# Patient Record
Sex: Male | Born: 1945 | Race: White | Hispanic: No | Marital: Married | Smoking: Never smoker
Health system: Southern US, Community
[De-identification: ages and names within clinical notes are randomized; demographics above are authoritative.]

## PROBLEM LIST (undated history)

## (undated) DIAGNOSIS — I10 Essential (primary) hypertension: Secondary | ICD-10-CM

## (undated) HISTORY — PX: HERNIA REPAIR: SHX51

## (undated) HISTORY — PX: ABDOMINAL AORTIC ANEURYSM REPAIR: SUR1152

## (undated) HISTORY — DX: Essential (primary) hypertension: I10

---

## 2012-10-04 ENCOUNTER — Emergency Department: Payer: Self-pay | Admitting: Emergency Medicine

## 2012-10-04 LAB — COMPREHENSIVE METABOLIC PANEL
Anion Gap: 6 — ABNORMAL LOW (ref 7–16)
BUN: 27 mg/dL — ABNORMAL HIGH (ref 7–18)
Chloride: 106 mmol/L (ref 98–107)
Creatinine: 1.36 mg/dL — ABNORMAL HIGH (ref 0.60–1.30)
EGFR (African American): >60
EGFR (Non-African Amer.): 53 — ABNORMAL LOW
Glucose: 130 mg/dL — ABNORMAL HIGH (ref 65–99)
Osmolality: 284 (ref 275–301)
SGOT(AST): 30 U/L (ref 15–37)
Total Protein: 7.2 g/dL (ref 6.4–8.2)

## 2012-10-04 LAB — CBC
MCHC: 34.3 g/dL (ref 32.0–36.0)
MCV: 89 fL (ref 80–100)
Platelet: 225 10*3/uL (ref 150–440)
RDW: 12.9 % (ref 11.5–14.5)
WBC: 7.4 10*3/uL (ref 3.8–10.6)

## 2012-10-04 LAB — CK TOTAL AND CKMB (NOT AT ARMC): CK, Total: 100 U/L (ref 35–232)

## 2012-10-04 LAB — TROPONIN I: Troponin-I: <0.02 ng/mL

## 2012-10-12 ENCOUNTER — Ambulatory Visit: Payer: Self-pay | Admitting: Family Medicine

## 2012-11-05 ENCOUNTER — Ambulatory Visit: Payer: Self-pay | Admitting: Gastroenterology

## 2013-04-25 ENCOUNTER — Ambulatory Visit: Payer: Self-pay | Admitting: Vascular Surgery

## 2013-06-07 DIAGNOSIS — Z8739 Personal history of other diseases of the musculoskeletal system and connective tissue: Secondary | ICD-10-CM | POA: Insufficient documentation

## 2013-06-07 DIAGNOSIS — Z8679 Personal history of other diseases of the circulatory system: Secondary | ICD-10-CM | POA: Insufficient documentation

## 2013-06-07 DIAGNOSIS — M109 Gout, unspecified: Secondary | ICD-10-CM | POA: Insufficient documentation

## 2013-06-18 ENCOUNTER — Ambulatory Visit: Payer: Self-pay | Admitting: Vascular Surgery

## 2013-06-18 LAB — CREATININE, SERUM
Creatinine: 1.41 mg/dL — ABNORMAL HIGH (ref 0.60–1.30)
EGFR (African American): 58 — ABNORMAL LOW
GFR CALC NON AF AMER: 50 — AB

## 2013-06-18 LAB — BUN: BUN: 37 mg/dL — AB (ref 7–18)

## 2013-06-26 ENCOUNTER — Ambulatory Visit: Payer: Self-pay | Admitting: Vascular Surgery

## 2013-06-26 LAB — URINALYSIS, COMPLETE
BACTERIA: NONE SEEN
Bilirubin,UR: NEGATIVE
Blood: NEGATIVE
Glucose,UR: NEGATIVE mg/dL (ref 0–75)
Ketone: NEGATIVE
Leukocyte Esterase: NEGATIVE
Nitrite: NEGATIVE
PH: 6 (ref 4.5–8.0)
Protein: NEGATIVE
RBC,UR: 1 /HPF (ref 0–5)
Specific Gravity: 1.014 (ref 1.003–1.030)
Squamous Epithelial: NONE SEEN
WBC UR: <1 /HPF (ref 0–5)

## 2013-06-26 LAB — BASIC METABOLIC PANEL
Anion Gap: 3 — ABNORMAL LOW (ref 7–16)
BUN: 27 mg/dL — AB (ref 7–18)
CALCIUM: 9.2 mg/dL (ref 8.5–10.1)
Chloride: 106 mmol/L (ref 98–107)
Co2: 30 mmol/L (ref 21–32)
Creatinine: 1.32 mg/dL — ABNORMAL HIGH (ref 0.60–1.30)
EGFR (African American): >60
EGFR (Non-African Amer.): 55 — ABNORMAL LOW
GLUCOSE: 106 mg/dL — AB (ref 65–99)
Osmolality: 283 (ref 275–301)
Potassium: 4.6 mmol/L (ref 3.5–5.1)
SODIUM: 139 mmol/L (ref 136–145)

## 2013-06-26 LAB — CBC
HCT: 41.6 % (ref 40.0–52.0)
HGB: 13.6 g/dL (ref 13.0–18.0)
MCH: 29.4 pg (ref 26.0–34.0)
MCHC: 32.6 g/dL (ref 32.0–36.0)
MCV: 90 fL (ref 80–100)
Platelet: 247 10*3/uL (ref 150–440)
RBC: 4.61 10*6/uL (ref 4.40–5.90)
RDW: 13.6 % (ref 11.5–14.5)
WBC: 7.4 10*3/uL (ref 3.8–10.6)

## 2013-07-03 ENCOUNTER — Inpatient Hospital Stay: Payer: Self-pay | Admitting: Vascular Surgery

## 2013-07-04 LAB — APTT: Activated PTT: 27.9 secs (ref 23.6–35.9)

## 2013-07-04 LAB — MAGNESIUM: Magnesium: 1.6 mg/dL — ABNORMAL LOW

## 2013-07-04 LAB — CBC WITH DIFFERENTIAL/PLATELET
BASOS ABS: 0.1 10*3/uL (ref 0.0–0.1)
Basophil %: 0.9 %
Eosinophil #: 0.2 10*3/uL (ref 0.0–0.7)
Eosinophil %: 2.2 %
HCT: 33.2 % — AB (ref 40.0–52.0)
HGB: 11.2 g/dL — ABNORMAL LOW (ref 13.0–18.0)
LYMPHS ABS: 0.6 10*3/uL — AB (ref 1.0–3.6)
LYMPHS PCT: 7.4 %
MCH: 30.1 pg (ref 26.0–34.0)
MCHC: 33.7 g/dL (ref 32.0–36.0)
MCV: 89 fL (ref 80–100)
Monocyte #: 0.7 x10 3/mm (ref 0.2–1.0)
Monocyte %: 8.5 %
Neutrophil #: 6.9 10*3/uL — ABNORMAL HIGH (ref 1.4–6.5)
Neutrophil %: 81 %
Platelet: 207 10*3/uL (ref 150–440)
RBC: 3.72 10*6/uL — AB (ref 4.40–5.90)
RDW: 13.2 % (ref 11.5–14.5)
WBC: 8.5 10*3/uL (ref 3.8–10.6)

## 2013-07-04 LAB — COMPREHENSIVE METABOLIC PANEL
ALBUMIN: 2.7 g/dL — AB (ref 3.4–5.0)
ALT: 15 U/L (ref 12–78)
Alkaline Phosphatase: 69 U/L
Anion Gap: 6 — ABNORMAL LOW (ref 7–16)
BUN: 17 mg/dL (ref 7–18)
Bilirubin,Total: 0.6 mg/dL (ref 0.2–1.0)
Calcium, Total: 8.5 mg/dL (ref 8.5–10.1)
Chloride: 105 mmol/L (ref 98–107)
Co2: 28 mmol/L (ref 21–32)
Creatinine: 1.15 mg/dL (ref 0.60–1.30)
EGFR (African American): >60
EGFR (Non-African Amer.): >60
Glucose: 131 mg/dL — ABNORMAL HIGH (ref 65–99)
Osmolality: 281 (ref 275–301)
Potassium: 4.3 mmol/L (ref 3.5–5.1)
SGOT(AST): 41 U/L — ABNORMAL HIGH (ref 15–37)
Sodium: 139 mmol/L (ref 136–145)
TOTAL PROTEIN: 5.8 g/dL — AB (ref 6.4–8.2)

## 2013-07-04 LAB — PROTIME-INR
INR: 1.1
Prothrombin Time: 13.9 secs (ref 11.5–14.7)

## 2013-07-04 LAB — PHOSPHORUS: Phosphorus: 3.1 mg/dL (ref 2.5–4.9)

## 2013-11-26 DIAGNOSIS — E1169 Type 2 diabetes mellitus with other specified complication: Secondary | ICD-10-CM | POA: Insufficient documentation

## 2014-05-10 NOTE — Op Note (Signed)
PATIENT NAME:  Jeffery Giles, Jeffery Giles MR#:  [id number] DATE OF BIRTH:  09/13/1945  DATE OF PROCEDURE:  07/03/2013  This is a co-surgeon note, along with Dr. Gilda CreaseSchnier.  I will be dictating his portion of the procedure.  PREOPERATIVE DIAGNOSES: 1.  Abdominal aortic, bilateral iliac artery aneurysms.  2.  Mild chronic kidney disease.  3.  Hypertension.  4.  Right external iliac artery dissection.  POSTOPERATIVE DIAGNOSES: 1.  Abdominal aortic, bilateral iliac artery aneurysms.  2.  Mild chronic kidney disease.  3.  Hypertension.  4.  Right external iliac artery dissection.   PROCEDURES: 1.  Ultrasound guidance for vascular access to bilateral femoral arteries, right by Dr. Gilda CreaseSchnier, left by Dr. Wyn Quakerew.  2.  Catheter placement in the left femoral artery by Dr. Gilda CreaseSchnier from right femoral approach.  3.  Catheter placement in aorta from left femoral approach by Dr. Wyn Quakerew.  4.  Placement of an Endologix Powerlink unibody endoprosthesis, co-surgeons.  5.  Placement of a left common iliac artery extender cuff, co-surgeons.  6.  Placement of 2 Endologix extension cuffs on the right and 2 Viabahn stents on the right for treatment of the aneurysm, as well as the right external iliac artery dissection that was creating an endoleak at the conclusion of the aneurysm, co-surgeons.  7.  ProGlide closure device bilateral femoral arteries, right by Dr. Gilda CreaseSchnier, left by Dr. Wyn Quakerew.   ANESTHESIA: General.   ESTIMATED BLOOD LOSS: Approximately 200 mL.  FLUOROSCOPY TIME:  24 minutes.  CONTRAST USED:  110 mL.   INDICATION FOR PROCEDURE: A 69 year old male with an iliac artery aneurysm, largest on the right. The left iliac artery was mildly aneurysmal to treat the entirety of this disease including the aorta. A typical endovascular stent graft was selected with planned extension down into the right iliac system. In addition, there was already present right iliac artery dissection down in the external iliac artery and we  planned to treat this as well. He had already had preemptive coiling of his right hypogastric artery.   DESCRIPTION OF PROCEDURE: The patient is brought to the vascular suite with help from anesthesia providing general anesthetic. His abdomen and groins were sterilely prepped and draped and a sterile surgical field was created. The femoral arteries were accessed bilaterally under direct ultrasound guidance. I performed the left; Dr. Gilda CreaseSchnier performed the right, and 6-French sheaths were placed. We then placed 2 ProGlide closure devices in a preclose fashion on each side. Again, Dr. Gilda CreaseSchnier performed the right and I performed the left. We upsized to 8-French sheaths. The Powerlink Excluder endoprosthesis was placed up in the right and I snared the contralateral wire from the left and pulled this out the left femoral sheath and Dr. Gilda CreaseSchnier advancing the catheter out the left femoral sheath from the right femoral approach. We then pulled the device down on the aortic bifurcation and completed its deployment.  I rewired the contralateral catheter with an 0.014 wire, and then exchanged for a pigtail catheter and performed imaging and then placed an Amplatz Super Stiff wire. To help treat the left iliac, an extension cuff on the left was performed. This was a 20 mm diameter x 55 mm length extension. This went down to about 1 cm to 2 cm from the hypogastric artery on the left.  On the right, there was a little more complex repair. We placed a 13 mm Viabahn up from the external iliac artery up to the junction of the main body from the right.  We then took an Endologix extender 16 mm in diameter from just below the flow divider down into the Viabahn stent; so 2 components were placed after the initial main body on the right. All junction points and seal zones were treated with the [facility] balloon. However, there was still significant endoleak on the right. This appeared to be from the proximal junction point and a 20 mm  diameter proximal and 13 mm diameter distal Endologix limb extender was used, placed just to the flow divider and down into the Viabahn stent. This was post dilated and this did improve the proximal seal; however, there was retrograde flow as we had not entirely treated the dissection in the external iliac artery. We had to extend an additional 13 mm diameter x 5 cm length Viabahn stent down to the very distal external iliac artery to completely exclude the dissection and avoid the retrograde flow that was then filling into the sac. This was post dilated with a 12 mm balloon. At this point, the pigtail catheter was placed off to the left and an excellent angiograph completion result was seen with patent renal arteries, patent left hypogastric artery, complete exclusion of the aneurysms without endoleak.  At this point, we elected to terminate the procedure. The sheaths removed. The ProGlides were secured down and hemostasis was achieved on each side. Each skin incision was closed with a single 4-0 Monocryl and Dermabond was placed as dressing. The patient was awakened from anesthesia and taken to the recovery room in stable condition having tolerated the procedure well.   ____________________________ Annice Needy, MD jsd:ce D: 07/03/2013 13:26:10 ET T: 07/03/2013 14:05:24 ET JOB#: [id number]  cc: Annice Needy, MD, <Dictator> Marisue Ivan, MD Annice Needy MD ELECTRONICALLY SIGNED 07/04/2013 10:53

## 2014-05-10 NOTE — Op Note (Signed)
PATIENT NAME:  Jeffery Giles, Jeffery Giles MR#:  [id number] DATE OF BIRTH:  06/20/1945  DATE OF PROCEDURE:  06/18/2013  PREOPERATIVE DIAGNOSES: 1.  Abdominal aortic aneurysm with common iliac artery aneurysm extending to the external and internal iliac arteries.  2.  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   POSTOPERATIVE DIAGNOSES: 1.  Abdominal aortic aneurysm with common iliac artery aneurysm extending to the external and internal iliac arteries.  2.  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   PROCEDURE PERFORMED:   1.  Introduction catheter into branches of the internal iliac artery, third order catheter placement.  2.  Angiography of the pelvis.  3.  Coil embolization of the internal iliac artery.   SURGEON: Hortencia Pilar, M.D.   SEDATION: Versed 5 mg plus fentanyl 200 mcg administered IV. Continuous ECG, pulse oximetry and cardiopulmonary monitoring was performed throughout the entire procedure by the interventional radiology nurse. Total sedation time was 1 hour 15 minutes.   ACCESS: A 6-French sheath, left common femoral artery.   FLUOROSCOPY TIME: 22.2 minutes.   CONTRAST USED: Isovue 50 mL.   INDICATIONS: Jeffery Giles is a 69 year old gentleman who has a large abdominal aortic aneurysm as well as bilateral common iliac artery aneurysms. On the right, aneurysm extends to include the origins of the external and internal and, therefore, in order to obtain appropriate feel using an endograft he will require coil embolization of his right internal iliac artery. In addition, the right internal iliac artery has an isolated aneurysm that begins approximately 12 to 14 mm from the origin and is a maximum of 19 mm in diameter. This will also treat this aneurysm and prevent future rupture. Risks and benefits are reviewed. All questions answered. The patient agrees to proceed.   DESCRIPTION OF PROCEDURE: The patient is taken to the special  procedure suite, placed in the supine position. After adequate sedation is achieved, both groins are prepped and draped in sterile fashion. Lidocaine 1% is infiltrated in the soft tissues overlying the femoral impulse on the left and access is obtained under ultrasound visualization. Common femoral artery is identified. It is echolucent and pulsatile, indicating patency. Image is recorded for the permanent record and under real-time visualization a micropuncture kit is utilized to access the artery. J-wire is then advanced followed by a 5-French sheath and a rim catheter. Rim catheter is used to hook the aortic bifurcation and a stiff-angled Glidewire is advanced down. Rim catheter is then advanced into the common iliac where hand injection of contrast is utilized to demonstrate the iliac bifurcation. Several oblique views are obtained. The exact origin remains somewhat difficult to ascertain and the stiff-angled Glidewire is reintroduced. Initially, a 5-French Ansell but ultimately a 6-French Balkan sheath is advanced up and over the bifurcation and positioned with its tip in the common. Several catheters are utilized. Ultimately, the C2 catheter and stiff-angled Glidewire gain access to the internal and the wire, catheter and subsequently sheath are advanced into the internal iliac artery aneurysm. Imaging is then obtained and subsequently coils are placed, a total of 7 coils, two 18 x 20 coils from [employer], two 12 x 40 from [employer], one 12 x 20 and one 10 x 20 from [employer]; in addition, a 20 x 20 nester coil was utilized. Followup imaging demonstrates sluggish flow through the aneurysm with densely packed coiling as would be desired. The sheath is pulled into the external on the left. Oblique view is  obtained and StarClose device is deployed. There are no immediate complications.   INTERPRETATION: Initial images demonstrate a large aneurysm within the common. There is also a 19 cm  isolated internal iliac artery aneurysm. There is a neck at the origin of the internal iliac which measures approximately 14 to 18 mm in length. Following soiling, there is a marked reduction in flow.   SUMMARY: Successful coil embolization of the internal iliac artery in preparation for  abdominal aortic aneurysm stent graft repair.    ____________________________ Katha Cabal, MD ggs:cs D: 06/18/2013 15:09:50 ET T: 06/18/2013 18:57:19 ET JOB#: [id number]  cc: Katha Cabal, MD, <Dictator> Dion Body, MD Katha Cabal MD ELECTRONICALLY SIGNED 07/02/2013 9:17

## 2014-05-10 NOTE — Op Note (Signed)
PATIENT NAME:  Jeffery Giles, Jeffery Giles MR#:  [id number] DATE OF BIRTH:  [birth year]  DATE OF PROCEDURE:  07/03/2013  PREOPERATIVE DIAGNOSES:  1. Bilateral iliac artery aneurysms.  2. Dissection of the right external iliac artery.   POSTOPERATIVE DIAGNOSES:  1. Bilateral iliac artery aneurysms.  2. Dissection of the right external iliac artery.   PROCEDURES PERFORMED:  1. Repair of bilateral iliac artery aneurysms using an Endologix stent graft.  2. 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  3. Introduction catheter into aorta percutaneously, right groin approach.  4. Introduction catheter into aorta percutaneously, left groin approach.  5. Iliac extension right side from right groin approach.  6. Iliac extension left side from left groin approach.   PROCEDURE PERFORMED BY: Dr. Gilda Crease and Dr. Wyn Quaker, co-surgeons.   ANESTHESIA: General by endotracheal intubation.   FLUIDS: Per anesthesia record.   ESTIMATED BLOOD LOSS: 200 mL.   SPECIMEN: None.   CONTRAST USED: Isovue 110 mL.   FLUOROSCOPY TIME: 24.3 minutes.   INDICATIONS:  This patient is a 69 year old gentleman who presented to the office after incidental finding of a very large right common iliac artery aneurysm. The aneurysm measured almost 5 cm in diameter, the typical size for repair if it were an aorta. He was also noted to have an internal iliac aneurysm on the right and the aneurysm extended to the external iliac on the right.  A dissection was also noted in the right external iliac artery at that time. On the left, a 2 cm iliac artery aneurysm was identified. The aorta was actually normal in size measuring 18 mm; therefore, this represented isolated bilateral common iliac artery aneurysms. The risks and benefits for angiography and intervention with exclusion using stent grafts was reviewed. All questions have been answered and the patient has agreed to proceed.   DESCRIPTION OF PROCEDURE: The patient is  taken to the special procedure suite, placed in the supine position. After adequate general anesthesia is induced and appropriate invasive monitors are placed, he is prepped from the nipple line down to the knees and then draped in a sterile fashion. Appropriate timeout is called.   Working simultaneously with Dr. Wyn Quaker on the left side and myself on the right, access to the common femoral arteries is obtained with ultrasound guidance. Ultrasound is placed in a sterile sleeve. Common femoral artery is identified.  Femoral bifurcation is localized and then the artery is scanned more proximally. Seldinger needle is then inserted into the anterior wall under direct visualization after images are recorded for the permanent record bilaterally. J-wires were then advanced and 6-French sheaths.   Using the pre-close technique, 2 Perclose devices are placed at the 11 o'clock and 1 o'clock  positions on the right and on the left.  The 8-French sheaths are then inserted. Pigtail catheter is then advanced into the aorta from the right and following AP imaging and then RAO imaging. A  Amplatz Super Stiff wire was advanced through the pigtail catheter and the pigtail catheter was removed. The sheath is upsized to an 18-French sheath; 6000 units of heparin were given prior to upsizing the sheath. A Kumpe catheter was then introduced into the aorta over the J-wire from the left, and subsequently the snare was advanced into the aorta from the left-sided approach.   After reviewing the initial images, a 25 x 80 x 16 x 40 main body Endologix prosthesis was opened and prepped onto the field.  It was then advanced up to  the hub of the sheath, and subsequently, the contralateral limb wire was advanced through the sheath. It was snared by Dr. Wyn Quakerew and then pulled out the left side. As the wire was being pulled out, the main body is being advanced until it is opened just above the bifurcation. The main body is then seated on the  bifurcation and the right limb is deployed. On the left, the .018 platinum plus wire was advanced through the hypotube and the outer sheath is removed from contralateral limb expanding the contralateral limb.   A pigtail catheter was then advanced over the .018 wire and an RAO projection of the left pelvis is obtained. Subsequently, an Amplatz wire is exchanged through the pigtail. The sheath is upsized again to the 18-French and a 20 x 20 x 50 contralateral extension limb is opened onto the field, prepped, and deployed into the existing main body prosthesis.   At this time, a 13 x 100 Viabahn stent is deployed extending several centimeters into the right external, and then a 16 x 16 x 5 limb is used to bridge the Viabahn to the main body of the Endologix.   CODA balloon is then advanced up the right and all limb junctions are angioplastied.  A CODA balloon is then advanced up the left and both junctions are angioplastied.   With the pigtail catheter from the left side, AP projection of the aorta and iliac artery system is performed and a type I endoleak is noted into the right common iliac. No endoleak on the left is noted. These areas were then re-angioplastied, but the leak persisted and, therefore, a 20 x 13 x 88 limb was then added, extending from the bifurcation of the main body down into the existing Viabahn.   AP projection of the aorta from the left-sided approach was again obtained and this time a delayed filling of the aneurysm sac was noted. Clearly, the type I leak previously identified was resolved, and on imaging more distally in the external, the re-entry point of the external iliac dissection remained uncovered and there was now flow down the endograft into the Viabahn and then retrograde through the dissection and back into the aneurysm sac. Therefore, a 13 x 50 Viabahn was opened, prepped on the back table, and deployed extending the right limb repair down to the lateral superficial  circumflex vessels.   Angioplasty was then performed, and again an AP projection from the main body was obtained this time demonstrating both right and left common iliac artery aneurysms were excluded and the iliac dissection was now completely excluded and there was no longer any flow in the false lumen.   The sheaths were then removed and the Perclose device is secured. Pressure was held for approximately 10 minutes, and subsequently, the skin was closed with a 4-0 Monocryl subcuticular and Dermabond. The patient tolerated the procedure well. There were no immediate complications. Sponge and needle counts were correct and he was taken to the recovery room in stable condition.   INTERPRETATION: Initially, there is a very large common iliac aneurysm on the right and a moderate-sized common iliac artery aneurysm on the left. There is also an external iliac artery dissection. Following deployment of the initial stents, there remains a type I endoleak and this ultimately is resolved by adding a 2nd extender limb. The dissection is covered proximally but not distally, and in order to completely exclude the dissection from retrograde flow, subsequently filling the aneurysm sac, a 2nd Viabahn stent  was utilized to extend the right limb down to just above the femoral head. Subsequent images demonstrate complete exclusion of both iliac artery aneurysms as well as the external iliac artery dissection.    ____________________________ Renford Dills, MD ggs:dd D: 07/04/2013 19:33:00 ET T: 07/05/2013 04:28:39 ET JOB#: [id number]  cc: Renford Dills, MD, <Dictator> Renford Dills MD ELECTRONICALLY SIGNED 07/23/2013 15:19

## 2015-12-22 DIAGNOSIS — Z7185 Encounter for immunization safety counseling: Secondary | ICD-10-CM | POA: Insufficient documentation

## 2016-08-10 ENCOUNTER — Other Ambulatory Visit (INDEPENDENT_AMBULATORY_CARE_PROVIDER_SITE_OTHER): Payer: Self-pay | Admitting: Vascular Surgery

## 2016-08-10 DIAGNOSIS — I70219 Atherosclerosis of native arteries of extremities with intermittent claudication, unspecified extremity: Secondary | ICD-10-CM | POA: Insufficient documentation

## 2016-08-10 DIAGNOSIS — I2510 Atherosclerotic heart disease of native coronary artery without angina pectoris: Secondary | ICD-10-CM | POA: Insufficient documentation

## 2016-08-10 DIAGNOSIS — E039 Hypothyroidism, unspecified: Secondary | ICD-10-CM | POA: Insufficient documentation

## 2016-08-10 DIAGNOSIS — I10 Essential (primary) hypertension: Secondary | ICD-10-CM | POA: Insufficient documentation

## 2016-08-10 DIAGNOSIS — I714 Abdominal aortic aneurysm, without rupture, unspecified: Secondary | ICD-10-CM | POA: Insufficient documentation

## 2016-08-10 DIAGNOSIS — Z95828 Presence of other vascular implants and grafts: Secondary | ICD-10-CM

## 2016-08-10 NOTE — Progress Notes (Signed)
MRN : [id number]  Jeffery Giles is a 71 y.o. (08/01/1945) male who presents with chief complaint of No chief complaint on file. Marland Kitchen.  History of Present Illness: The patient returns to the office for surveillance of an abdominal aortic aneurysm status post stent graft placement on:  PROCEDURES PERFORMED on 07/03/2013:  1.          Repair of bilateral iliac artery aneurysms using an Endologix stent graft.  2.          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  3.          Introduction catheter into aorta percutaneously, right groin approach.  4.          Introduction catheter into aorta percutaneously, left groin approach.  5.          Iliac extension right side from right groin approach.  6.          Iliac extension left side from left groin approach.   Patient denies abdominal pain or back pain, no other abdominal complaints. No groin related complaints. No symptoms consistent with distal embolization No changes in claudication distance.   There have been no interval changes in his overall healthcare since his last visit.   Patient denies amaurosis fugax or TIA symptoms. There is no history of claudication or rest pain symptoms of the lower extremities. The patient denies angina or shortness of breath.   Duplex US of the aorta and iliac arteries shows a stable aneurysm sac with no endoleak.     No outpatient prescriptions have been marked as taking for the 08/11/16 encounter (Appointment) with Gilda CreaseSchnier, Latina CraverGregory G, MD.    No past medical history on file.  No past surgical history on file.  Social History Social History  Substance Use Topics  . Smoking status: Not on file  . Smokeless tobacco: Not on file  . Alcohol use Not on file    Family History No family history on file.  Allergies not on file   REVIEW OF SYSTEMS (Negative unless checked)  Constitutional: [] Weight loss  [] Fever  [] Chills Cardiac: [] Chest pain   [] Chest pressure    [] Palpitations   [] Shortness of breath when laying flat   [] Shortness of breath with exertion. Vascular:  [] Pain in legs with walking   [] Pain in legs at rest  [] History of DVT   [] Phlebitis   [] Swelling in legs   [] Varicose veins   [] Non-healing ulcers Pulmonary:   [] Uses home oxygen   [] Productive cough   [] Hemoptysis   [] Wheeze  [] COPD   [] Asthma Neurologic:  [] Dizziness   [] Seizures   [] History of stroke   [] History of TIA  [] Aphasia   [] Vissual changes   [] Weakness or numbness in arm   [] Weakness or numbness in leg Musculoskeletal:   [] Joint swelling   [] Joint pain   [] Low back pain Hematologic:  [] Easy bruising  [] Easy bleeding   [] Hypercoagulable state   [] Anemic Gastrointestinal:  [] Diarrhea   [] Vomiting  [] Gastroesophageal reflux/heartburn   [] Difficulty swallowing. Genitourinary:  [] Chronic kidney disease   [] Difficult urination  [] Frequent urination   [] Blood in urine Skin:  [] Rashes   [] Ulcers  Psychological:  [] History of anxiety   []  History of major depression.  Physical Examination  There were no vitals filed for this visit. There is no height or weight on file to calculate BMI. Gen: WD/WN, NAD Head: [place]/AT, No temporalis wasting.  Ear/Nose/Throat: Hearing grossly intact, nares w/o erythema  or drainage Eyes: PER, EOMI, sclera nonicteric.  Neck: Supple, no large masses.   Pulmonary:  Good air movement, no audible wheezing bilaterally, no use of accessory muscles.  Cardiac: RRR, no JVD Vascular: no carotid bruits Vessel Right Left  Radial Palpable Palpable  PT Palpable Palpable  DP Palpable Palpable  Gastrointestinal: Non-distended. No guarding/no peritoneal signs.  Musculoskeletal: M/S 5/5 throughout.  No deformity or atrophy.  Neurologic: CN 2-12 intact. Symmetrical.  Speech is fluent. Motor exam as listed above. Psychiatric: Judgment intact, Mood & affect appropriate for pt's clinical situation. Dermatologic: No rashes or ulcers noted.  No changes consistent with  cellulitis. Lymph : No lichenification or skin changes of chronic lymphedema.  CBC Lab Results  Component Value Date   WBC 8.5 07/04/2013   HGB 11.2 (L) 07/04/2013   HCT 33.2 (L) 07/04/2013   MCV 89 07/04/2013   PLT 207 07/04/2013    BMET    Component Value Date/Time   NA 139 07/04/2013 0345   K 4.3 07/04/2013 0345   CL 105 07/04/2013 0345   CO2 28 07/04/2013 0345   GLUCOSE 131 (H) 07/04/2013 0345   BUN 17 07/04/2013 0345   CREATININE 1.15 07/04/2013 0345   CALCIUM 8.5 07/04/2013 0345   GFRNONAA >60 07/04/2013 0345   GFRAA >60 07/04/2013 0345   CrCl cannot be calculated (Patient's most recent lab result is older than the maximum 21 days allowed.).  COAG Lab Results  Component Value Date   INR 1.1 07/04/2013    Radiology No results found.  Assessment/Plan 1. AAA (abdominal aortic aneurysm) without rupture (HCC) Recommend: Patient is status post successful endovascular repair of the AAA.   No further intervention is required at this time.   No endoleak is detected and the aneurysm sac is stable.  The patient will continue antiplatelet therapy as prescribed as well as aggressive management of hyperlipidemia. Exercise is again strongly encouraged.   However, endografts require continued surveillance with ultrasound or CT scan. This is mandatory to detect any changes that allow repressurization of the aneurysm sac.  The patient is informed that this would be asymptomatic.  The patient is reminded that lifelong routine surveillance is a necessity with an endograft. Patient will continue to follow-up at 6 month intervals with ultrasound of the aorta.  - VAS US AORTA/IVC/ILIACS; Future  2. Atherosclerosis of native artery of both lower extremities with intermittent claudication (HCC)  Recommend:  The patient has evidence of atherosclerosis of the lower extremities with claudication.  The patient does not voice lifestyle limiting changes at this point in  time.  Noninvasive studies do not suggest clinically significant change.  No invasive studies, angiography or surgery at this time The patient should continue walking and begin a more formal exercise program.  The patient should continue antiplatelet therapy and aggressive treatment of the lipid abnormalities  No changes in the patient's medications at this time  The patient should continue wearing graduated compression socks 10-15 mmHg strength to control the mild edema.    3. Essential hypertension Continue antihypertensive medications as already ordered, these medications have been reviewed and there are no changes at this time.   4. Hypothyroidism, unspecified type Continue Synthroid as already ordered, these medications have been reviewed and there are no changes at this time.   5. Coronary artery disease involving native coronary artery of native heart with angina pectoris (HCC) Continue cardiac and antihypertensive medications as already ordered and reviewed, no changes at this time.  Continue statin as  ordered and reviewed, no changes at this time  Nitrates PRN for chest pain     Levora Dredge, MD  08/10/2016 10:52 AM

## 2016-08-11 ENCOUNTER — Encounter (INDEPENDENT_AMBULATORY_CARE_PROVIDER_SITE_OTHER): Payer: Self-pay

## 2016-08-11 ENCOUNTER — Ambulatory Visit (INDEPENDENT_AMBULATORY_CARE_PROVIDER_SITE_OTHER): Payer: BLUE CROSS/BLUE SHIELD

## 2016-08-11 ENCOUNTER — Ambulatory Visit (INDEPENDENT_AMBULATORY_CARE_PROVIDER_SITE_OTHER): Payer: BLUE CROSS/BLUE SHIELD | Admitting: Vascular Surgery

## 2016-08-11 ENCOUNTER — Other Ambulatory Visit (INDEPENDENT_AMBULATORY_CARE_PROVIDER_SITE_OTHER): Payer: Self-pay | Admitting: Vascular Surgery

## 2016-08-11 ENCOUNTER — Encounter (INDEPENDENT_AMBULATORY_CARE_PROVIDER_SITE_OTHER): Payer: Self-pay | Admitting: Vascular Surgery

## 2016-08-11 DIAGNOSIS — I25119 Atherosclerotic heart disease of native coronary artery with unspecified angina pectoris: Secondary | ICD-10-CM | POA: Diagnosis not present

## 2016-08-11 DIAGNOSIS — I10 Essential (primary) hypertension: Secondary | ICD-10-CM | POA: Diagnosis not present

## 2016-08-11 DIAGNOSIS — Z8679 Personal history of other diseases of the circulatory system: Secondary | ICD-10-CM

## 2016-08-11 DIAGNOSIS — E039 Hypothyroidism, unspecified: Secondary | ICD-10-CM

## 2016-08-11 DIAGNOSIS — I714 Abdominal aortic aneurysm, without rupture, unspecified: Secondary | ICD-10-CM

## 2016-08-11 DIAGNOSIS — Z9889 Other specified postprocedural states: Secondary | ICD-10-CM

## 2016-08-11 DIAGNOSIS — I723 Aneurysm of iliac artery: Secondary | ICD-10-CM | POA: Diagnosis not present

## 2016-08-11 DIAGNOSIS — Z95828 Presence of other vascular implants and grafts: Secondary | ICD-10-CM

## 2016-08-11 DIAGNOSIS — I70213 Atherosclerosis of native arteries of extremities with intermittent claudication, bilateral legs: Secondary | ICD-10-CM

## 2017-08-10 ENCOUNTER — Encounter (INDEPENDENT_AMBULATORY_CARE_PROVIDER_SITE_OTHER): Payer: Self-pay

## 2017-08-10 ENCOUNTER — Encounter (INDEPENDENT_AMBULATORY_CARE_PROVIDER_SITE_OTHER): Payer: Self-pay | Admitting: Vascular Surgery

## 2017-08-10 ENCOUNTER — Other Ambulatory Visit (INDEPENDENT_AMBULATORY_CARE_PROVIDER_SITE_OTHER): Payer: BLUE CROSS/BLUE SHIELD

## 2017-08-10 ENCOUNTER — Ambulatory Visit (INDEPENDENT_AMBULATORY_CARE_PROVIDER_SITE_OTHER): Payer: BLUE CROSS/BLUE SHIELD | Admitting: Vascular Surgery

## 2017-08-10 ENCOUNTER — Other Ambulatory Visit (INDEPENDENT_AMBULATORY_CARE_PROVIDER_SITE_OTHER): Payer: Self-pay | Admitting: Vascular Surgery

## 2017-08-10 VITALS — BP 149/83 | HR 55 | Resp 16 | Ht 72.0 in | Wt 204.0 lb

## 2017-08-10 DIAGNOSIS — I10 Essential (primary) hypertension: Secondary | ICD-10-CM

## 2017-08-10 DIAGNOSIS — I714 Abdominal aortic aneurysm, without rupture, unspecified: Secondary | ICD-10-CM

## 2017-08-10 DIAGNOSIS — I25119 Atherosclerotic heart disease of native coronary artery with unspecified angina pectoris: Secondary | ICD-10-CM | POA: Diagnosis not present

## 2017-08-10 DIAGNOSIS — I70213 Atherosclerosis of native arteries of extremities with intermittent claudication, bilateral legs: Secondary | ICD-10-CM | POA: Diagnosis not present

## 2017-08-10 DIAGNOSIS — I713 Abdominal aortic aneurysm, ruptured, unspecified: Secondary | ICD-10-CM

## 2017-08-10 NOTE — Progress Notes (Signed)
MRN : [id number]  Jeffery Giles is a 72 y.o. (1945-10-13) male who presents with chief complaint of  Chief Complaint  Patient presents with  . Follow-up    53yr Aorta Iliac  .  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  3.Introduction catheter into aorta percutaneously, right groin approach.  4.Introduction catheter into aorta percutaneously, left groin approach.  5.Iliac extension right side from right groin approach.  6.Iliac extension left side from left groin approach.   Patient denies abdominal pain or back pain, no other abdominal complaints. No groin related complaints. No symptoms consistent with distal embolization No changes in claudication distance.   There have been no interval changes in his overall healthcare since his last visit.   Patient denies amaurosis fugax or TIA symptoms. There is no history of claudication or rest pain symptoms of the lower extremities. The patient denies angina or shortness of breath.   Previous duplex US of the aorta and iliac arteries shows a stable aneurysm sac with no endoleak.  Today the right common iliac artery aneurysm has increased in size (prevously 5.0 cm) to 5.4 cm and there is evidence of an endoleak unspecified     Current Meds  Medication Sig  . allopurinol (ZYLOPRIM) 300 MG tablet TAKE 1 TABLET (300 MG TOTAL) BY MOUTH ONCE DAILY.  Marland Kitchen aspirin EC 81 MG tablet Take by mouth.  . bisoprolol-hydrochlorothiazide (ZIAC) 10-6.25 MG tablet   . indomethacin (INDOCIN) 50 MG capsule TAKE 1 CAPSULE (50 MG TOTAL) BY MOUTH 2 (TWO) TIMES DAILY AS NEEDED.  Marland Kitchen levothyroxine  (SYNTHROID, LEVOTHROID) 100 MCG tablet Take 112 mcg by mouth.     Past Medical History:  Diagnosis Date  . Hypertension     Past Surgical History:  Procedure Laterality Date  . ABDOMINAL AORTIC ANEURYSM REPAIR    . HERNIA REPAIR      Social History Social History   Tobacco Use  . Smoking status: Never Smoker  . Smokeless tobacco: Never Used  Substance Use Topics  . Alcohol use: Yes    Comment: ocassionally  . Drug use: No    Family History Family History  Problem Relation Age of Onset  . Stroke Father     No Known Allergies   REVIEW OF SYSTEMS (Negative unless checked)  Constitutional: [] Weight loss  [] Fever  [] Chills Cardiac: [] Chest pain   [] Chest pressure   [] Palpitations   [] Shortness of breath when laying flat   [] Shortness of breath with exertion. Vascular:  [x] Pain in legs with walking   [] Pain in legs at rest  [] History of DVT   [] Phlebitis   [] Swelling in legs   [] Varicose veins   [] Non-healing ulcers Pulmonary:   [] Uses home oxygen   [] Productive cough   [] Hemoptysis   [] Wheeze  [] COPD   [] Asthma Neurologic:  [] Dizziness   [] Seizures   [] History of stroke   [] History of TIA  [] Aphasia   [] Vissual changes   [] Weakness or numbness in arm   [] Weakness or numbness in leg Musculoskeletal:   [] Joint swelling   [] Joint pain   [] Low back pain Hematologic:  [] Easy bruising  [] Easy bleeding   [] Hypercoagulable state   [] Anemic Gastrointestinal:  [] Diarrhea   [] Vomiting  [] Gastroesophageal  reflux/heartburn   [] Difficulty swallowing. Genitourinary:  [] Chronic kidney disease   [] Difficult urination  [] Frequent urination   [] Blood in urine Skin:  [] Rashes   [] Ulcers  Psychological:  [] History of anxiety   []  History of major depression.  Physical Examination  Vitals:   08/10/17 0846  BP: (!) 149/83  Pulse: (!) 55  Resp: 16  Weight: 204 lb (92.5 kg)  Height: 6' (1.829 m)   Body mass index is 27.67 kg/m. Gen: WD/WN, NAD Head: [place]/AT, No temporalis wasting.    Ear/Nose/Throat: Hearing grossly intact, nares w/o erythema or drainage Eyes: PER, EOMI, sclera nonicteric.  Neck: Supple, no large masses.   Pulmonary:  Good air movement, no audible wheezing bilaterally, no use of accessory muscles.  Cardiac: RRR, no JVD Vascular:  Vessel Right Left  Radial Palpable Palpable  Popliteal Palpable Palpable  PT Not Palpable Not Palpable  DP Not Palpable Not Palpable  Gastrointestinal: Non-distended. No guarding/no peritoneal signs.  Musculoskeletal: M/S 5/5 throughout.  No deformity or atrophy.  Neurologic: CN 2-12 intact. Symmetrical.  Speech is fluent. Motor exam as listed above. Psychiatric: Judgment intact, Mood & affect appropriate for pt's clinical situation. Dermatologic: No rashes or ulcers noted.  No changes consistent with cellulitis. Lymph : No lichenification or skin changes of chronic lymphedema.  CBC Lab Results  Component Value Date   WBC 8.5 07/04/2013   HGB 11.2 (L) 07/04/2013   HCT 33.2 (L) 07/04/2013   MCV 89 07/04/2013   PLT 207 07/04/2013    BMET    Component Value Date/Time   NA 139 07/04/2013 0345   K 4.3 07/04/2013 0345   CL 105 07/04/2013 0345   CO2 28 07/04/2013 0345   GLUCOSE 131 (H) 07/04/2013 0345   BUN 17 07/04/2013 0345   CREATININE 1.15 07/04/2013 0345   CALCIUM 8.5 07/04/2013 0345   GFRNONAA >60 07/04/2013 0345   GFRAA >60 07/04/2013 0345   CrCl cannot be calculated (Patient's most recent lab result is older than the maximum 21 days allowed.).  COAG Lab Results  Component Value Date   INR 1.1 07/04/2013    Radiology No results found.    Assessment/Plan  1. AAA (abdominal aortic aneurysm) without rupture (HCC) Recommend: The patient has an abdominal aortic aneurysm that is s/p EVAR but has now increased in size and shows an endoleak by duplex scan.  This may need additional repair.  The patient is otherwise in reasonable health.   Therefore, the patient may need to undergo endovascular  repair of the AAA to prevent future leathal rupture.   Patient will require CT angiography of the abdomen and pelvis in order to appropriately plan repair of the AAA.  The risks and benefits as well as the alternative therapies was discussed in detail with the patient. All questions were answered. The patient agrees to move forward the AAA repair and therefore with CT scan.  The patient will follow up with me in the office after the CT scan to review the study.    CT ANGIO ABDOMEN PELVIS  W &/OR WO CONTRAST; Future (HCC)   2. Atherosclerosis of native artery of both lower extremities with intermittent claudication   Recommend:  The patient has evidence of atherosclerosis of the lower extremities with claudication.  The patient does not voice lifestyle limiting changes at this point in time.  Noninvasive studies do not suggest clinically significant change.  No invasive studies, angiography or surgery at this time The patient should continue walking and begin a  more formal exercise program.  The patient should continue antiplatelet therapy and aggressive treatment of the lipid abnormalities  No changes in the patient's medications at this time  The patient should continue wearing graduated compression socks 10-15 mmHg strength to control the mild edema.    3. Coronary artery disease involving native coronary artery of native heart with angina pectoris (HCC) Continue cardiac and antihypertensive medications as already ordered and reviewed, no changes at this time.  Continue statin as ordered and reviewed, no changes at this time  Nitrates PRN for chest pain   4. Essential hypertension Continue antihypertensive medications as already ordered, these medications have been reviewed and there are no changes at this time.      Levora DredgeGregory Glendel Jaggers, MD  08/10/2017 9:02 AM

## 2017-08-25 ENCOUNTER — Ambulatory Visit
Admission: RE | Admit: 2017-08-25 | Discharge: 2017-08-25 | Disposition: A | Discharge status: Home / Self Care | Payer: BLUE CROSS/BLUE SHIELD | Source: Ambulatory Visit | Attending: Vascular Surgery | Admitting: Vascular Surgery

## 2017-08-25 DIAGNOSIS — I713 Abdominal aortic aneurysm, ruptured, unspecified: Secondary | ICD-10-CM

## 2017-08-25 DIAGNOSIS — I723 Aneurysm of iliac artery: Secondary | ICD-10-CM | POA: Diagnosis not present

## 2017-08-25 DIAGNOSIS — Z95828 Presence of other vascular implants and grafts: Secondary | ICD-10-CM | POA: Insufficient documentation

## 2017-08-25 DIAGNOSIS — I722 Aneurysm of renal artery: Secondary | ICD-10-CM | POA: Diagnosis not present

## 2017-08-25 DIAGNOSIS — N200 Calculus of kidney: Secondary | ICD-10-CM | POA: Insufficient documentation

## 2017-08-25 DIAGNOSIS — K5730 Diverticulosis of large intestine without perforation or abscess without bleeding: Secondary | ICD-10-CM | POA: Diagnosis not present

## 2017-08-25 LAB — POCT I-STAT CREATININE: CREATININE: 1.2 mg/dL (ref 0.61–1.24)

## 2017-08-25 MED ORDER — IOPAMIDOL (ISOVUE-370) INJECTION 76%
100.0000 mL | Freq: Once | INTRAVENOUS | Status: AC | PRN
  Administered 2017-08-25: 100 mL via INTRAVENOUS

## 2017-09-04 ENCOUNTER — Encounter (INDEPENDENT_AMBULATORY_CARE_PROVIDER_SITE_OTHER): Payer: Self-pay | Admitting: Vascular Surgery

## 2017-09-04 ENCOUNTER — Ambulatory Visit (INDEPENDENT_AMBULATORY_CARE_PROVIDER_SITE_OTHER): Payer: BLUE CROSS/BLUE SHIELD | Admitting: Vascular Surgery

## 2017-09-04 VITALS — BP 149/79 | HR 57 | Resp 12 | Ht 72.0 in | Wt 204.0 lb

## 2017-09-04 DIAGNOSIS — I714 Abdominal aortic aneurysm, without rupture, unspecified: Secondary | ICD-10-CM

## 2017-09-04 DIAGNOSIS — I70213 Atherosclerosis of native arteries of extremities with intermittent claudication, bilateral legs: Secondary | ICD-10-CM | POA: Diagnosis not present

## 2017-09-04 DIAGNOSIS — I25119 Atherosclerotic heart disease of native coronary artery with unspecified angina pectoris: Secondary | ICD-10-CM

## 2017-09-04 DIAGNOSIS — I10 Essential (primary) hypertension: Secondary | ICD-10-CM

## 2017-09-04 NOTE — Progress Notes (Signed)
MRN : [id number]  Jeffery PhoenixCharles E Giles is a 72 y.o. (06/06/1945) male who presents with chief complaint of No chief complaint on file. Marland Kitchen.  History of Present Illness: The patient is seen for follow up evaluation of AAA status post CTA. There were no problems or complications related to the CT scan. The patient denies interval development of abdominal or back pain. No new lower extremity pain or discoloration of the toes.   The patient has a history of coronary artery disease, no recent episodes of angina or shortness of breath. The patient denies interval anaurosis fugax. There is o recent history of TIA symptoms or focal motor deficits. The patient denies PAD or claudication symptoms. There is a history of hyperlipidemia which is being treated with a statin.   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   No outpatient medications have been marked as taking for the 09/04/17 encounter (Appointment) with Gilda CreaseSchnier, Latina CraverGregory G, MD.    Past Medical History:  Diagnosis Date  . Hypertension     Past Surgical History:  Procedure Laterality Date  . ABDOMINAL AORTIC ANEURYSM REPAIR    . HERNIA REPAIR      Social History Social History   Tobacco Use  . Smoking status: Never Smoker  . Smokeless tobacco: Never Used  Substance Use Topics  . Alcohol use: Yes    Comment: ocassionally  . Drug use: No    Family History Family History  Problem Relation Age of Onset  . Stroke Father     No Known Allergies   REVIEW OF SYSTEMS (Negative unless checked)  Constitutional: [] Weight loss  [] Fever  [] Chills Cardiac: [] Chest pain   [] Chest pressure   [] Palpitations   [] Shortness of breath when laying flat   [] Shortness of breath with exertion. Vascular:  [] Pain in legs with walking   [] Pain in legs at rest  [] History of DVT   [] Phlebitis   [] Swelling in legs   [] Varicose veins   [] Non-healing ulcers Pulmonary:   [] Uses home oxygen   [] Productive  cough   [] Hemoptysis   [] Wheeze  [] COPD   [] Asthma Neurologic:  [] Dizziness   [] Seizures   [] History of stroke   [] History of TIA  [] Aphasia   [] Vissual changes   [] Weakness or numbness in arm   [] Weakness or numbness in leg Musculoskeletal:   [] Joint swelling   [] Joint pain   [] Low back pain Hematologic:  [] Easy bruising  [] Easy bleeding   [] Hypercoagulable state   [] Anemic Gastrointestinal:  [] Diarrhea   [] Vomiting  [] Gastroesophageal reflux/heartburn   [] Difficulty swallowing. Genitourinary:  [] Chronic kidney disease   [] Difficult urination  [] Frequent urination   [] Blood in urine Skin:  [] Rashes   [] Ulcers  Psychological:  [] History of anxiety   []  History of major depression.  Physical Examination  There were no vitals filed for this visit. There is no height or weight on file to calculate BMI. Gen: WD/WN, NAD Head: [place]/AT, No temporalis wasting.  Ear/Nose/Throat: Hearing grossly intact, nares w/o erythema or drainage Eyes: PER, EOMI, sclera nonicteric.  Neck: Supple, no large masses.   Pulmonary:  Good air movement, no audible wheezing bilaterally, no use of accessory muscles.  Cardiac: RRR, no JVD Vascular:  Vessel Right Left  Radial Palpable Palpable  PT Palpable Palpable  DP Palpable Palpable  Gastrointestinal: Non-distended. No guarding/no peritoneal signs.  Musculoskeletal: M/S 5/5 throughout.  No deformity or atrophy.  Neurologic: CN 2-12 intact. Symmetrical.  Speech is fluent. Motor exam as listed above. Psychiatric: Judgment intact, Mood & affect appropriate for pt's clinical situation. Dermatologic: No rashes or ulcers noted.  No changes consistent with cellulitis. Lymph : No lichenification or skin changes of chronic lymphedema.  CBC Lab Results  Component Value Date   WBC 8.5 07/04/2013   HGB 11.2 (L) 07/04/2013   HCT 33.2 (L) 07/04/2013   MCV 89 07/04/2013   PLT 207 07/04/2013    BMET    Component Value Date/Time   NA 139 07/04/2013 0345   K 4.3  07/04/2013 0345   CL 105 07/04/2013 0345   CO2 28 07/04/2013 0345   GLUCOSE 131 (H) 07/04/2013 0345   BUN 17 07/04/2013 0345   CREATININE 1.20 08/25/2017 1129   CREATININE 1.15 07/04/2013 0345   CALCIUM 8.5 07/04/2013 0345   GFRNONAA >60 07/04/2013 0345   GFRAA >60 07/04/2013 0345   CrCl cannot be calculated (Unknown ideal weight.).  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 EXAM: CT ANGIOGRAPHY ABDOMEN AND PELVIS WITH CONTRAST AND WITHOUT CONTRAST TECHNIQUE: Multidetector CT imaging of the abdomen and pelvis was performed using the standard protocol during bolus administration of intravenous contrast. Multiplanar reconstructed images and MIPs were obtained and reviewed to evaluate the vascular anatomy. CONTRAST:  ISOVUE-370 IOPAMIDOL (ISOVUE-370) INJECTION 76% COMPARISON:  CT abdomen and pelvis - 04/25/2013 FINDINGS: VASCULAR Aorta: Interval endovascular repair of right common iliac artery aneurysm with placement of a abdominal aortic endograft. The proximal limb of the stent graft appears incompletely apposed against the right side of the infrarenal abdominal aorta (image 101, series 8; coronal image 78, series 11 however there is no definitive opacification the otherwise excluded native abdominal aorta. The cranial aspect of the endovascular stent appears incompletely expanded at its mid aspect (axial image 118, series 8; coronal image 68, series 11). The native abdominal aortic aneurysm sac has minimally increased in size the interval, currently measuring 2.8 x 2.6 x 2.8 cm, as measured in greatest oblique short axis axial (image 121, series 8), coronal (image 68, series 11) and sagittal (image 95, series 13) dimensions respectively, previously,  previously, 2.4 x 2.2 x 2.2 cm. While a definitive endoleak is not identified, there is increased attenuation of the mural thrombus within the native abdominal aortic aneurysm sac when comparing the precontrast images to the delayed portal venous phase images (representative precontrast image 15, series 6; portal venous phase image 21, series 19 and precontrast image 19, series 6, portal venous phase image 24, series 19), worrisome for the presence of a endoleak. Again a definitive source is not identified though the IMA does appear patent. Note, the right common iliac artery aneurysm has increased in size though there is no definitive evidence of persistent opacification the right common iliac artery aneurysm on this examination, specifically, the Hounsfield units on the precontrast and portal venous phase images is unchanged (precontrast image 32, series 6; portal venous phase image 36, series 19). Celiac: Widely patent without hemodynamically significant narrowing. Conventional branching pattern. SMA: Widely patent without hemodynamically significant narrowing. Conventional branching pattern. The distal tributaries the SMA appear widely patent without discrete intraluminal filling defect to suggest distal embolism. Renals: There is a tiny accessory left renal artery which supplies the inferior pole the right  kidney. Both dominant right-sided renal arteries appear widely patent without hemodynamically significant stenosis. Unchanged approximately 1.3 x 1.0 cm aneurysm arising from the central aspect of the inferior division of the right renal artery. No associated mural thrombus or perivascular stranding. Mild fusiform ectasia of the superior division of the right renal artery without associated aneurysm. No vessel irregularity to suggest FMD. IMA: Appears patent and potentially contributed to a type 2 endoleak. Inflow: There is extension of the endovascular aortic stent graft across the residual large right  common iliac artery aneurysm. Overlapping stent grafts appear widely patent. The distal anastomosis is well apposed against the walls of the distal aspect of the right external iliac artery. The right common iliac artery aneurysm has increased in size, currently measuring 5.7 x 5.8 x 5.4 cm as measured in greatest oblique short axis axial (image 54, series 8), coronal (image 79, series 11) and sagittal (image 81, series 13) dimensions respectively, previously, 4.7 x 5.2 x 5.0 cm. Stable sequela of right internal iliac artery coil embolism without definitive evidence of persistent opacification though evaluation degraded secondary streak artifact from the coil pack. The left limb of the aortic stent graft is well apposed against the walls of the left common external iliac artery. The left external iliac artery remains mildly ectatic measuring 1.9 cm in diameter (axial image 159, series 8), unchanged compared to the 04/2013 examination. Mild ectasia of the right internal iliac artery. The left external iliac artery is of normal caliber and widely patent without hemodynamically significant stenosis. Surgical clip adjacent to the left common femoral artery likely represents the location of prior arterial access. Proximal Outflow: The bilateral common and imaged portions of the bilateral superficial and deep femoral arte arteries are of normal caliber and widely patent without hemodynamically significant stenosis. Veins: The IVC and pelvic venous system appear widely patent. Note is again made of a circumaortic left renal vein. Review of the MIP images confirms the above findings. NON-VASCULAR Lower chest: Limited visualization of the lower thorax demonstrates minimal dependent subpleural ground-glass atelectasis. No discrete focal airspace opacities. Cardiomegaly.  No pericardial effusion. Hepatobiliary: Normal hepatic contour. No discrete hepatic lesions. Normal appearance of the gallbladder given degree distention. No  radiopaque gallstones. No intra extrahepatic biliary duct dilatation. No ascites. Pancreas: Normal appearance of the pancreas Spleen: Normal appearance of the spleen Adrenals/Urinary Tract: Note is made of an approximately 1.7 cm hypoattenuating nonenhancing partially exophytic left-sided renal cyst. Note is made of mild fetal lobulation of the bilateral kidneys. Nonobstructing left-sided nephrolithiasis with dominant nonobstructing left-sided renal stone measuring 5 mm in diameter (image 7, series 6). There is symmetric enhancement and excretion of the bilateral kidneys. There is a minimal amount of likely age and body habitus related bilateral perinephric stranding. No urine obstruction. Normal appearance of the bilateral adrenal glands. Normal appearance of the urinary bladder given degree distention. Stomach/Bowel: Colonic diverticulosis without evidence diverticulitis apparent circumferential wall thickening involving the rectum similar though likely progressed compared to remote abdominal CT performed in 2015. Moderate colonic stool burden without evidence of enteric obstruction. Normal appearance of the terminal ileum and appendix. No pneumoperitoneum, pneumatosis or portal venous gas. Lymphatic: No bulky retroperitoneal, mesenteric, pelvic or inguinal lymphadenopathy. Reproductive: Dystrophic calcifications within normal sized prostate gland. Multiple phleboliths are seen with the lower pelvis bilaterally. No free fluid in the pelvic cul-de-sac. Other: Regional soft tissues appear normal. Musculoskeletal: No acute or aggressive osseous abnormalities. Moderate to severe multilevel lumbar spine DDD, worse at L2-L3, L4-L5 and L5-S1 with  disc space height loss, endplate irregularity and sclerosis. IMPRESSION: VASCULAR 1. Post endovascular repair of right common iliac artery aneurysm with extension of an aortic stent graft. While the stent graft appears patent, the cranial aspect of the endovascular stent graft  is incompletely apposed to the wall of the infrarenal abdominal aorta and there appears to be incomplete expansion of the cranial aspect of the stent graft. 2. Interval increase in size of both the native abdominal aortic aneurysm currently measuring 2.8 cm, previously, 2.4 cm, as well as the right common iliac artery aneurysm currently measuring 5.8 cm, previously, 5.2 cm. While a definitive endoleak is not identified, there is apparent opacification of the native abdominal aortic aneurysm sac and there is suspected persistent opacification of the origin of the IMA potentially indicative of a very small type 2 endoleak versus a type 4/type 5 endoleak. Further evaluation with dedicated abdominal aortic and mesenteric arteriogram could be performed as indicated. 3. Post coil embolization of the right internal iliac artery without definitive evidence of recanalization given associated streak artifact. 4. 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 NON-VASCULAR 1. Colonic diverticulosis without evidence of diverticulitis. 2. Nonobstructing left-sided nephrolithiasis. Electronically Signed   By: Simonne ComeJohn  Watts M.D.   On: 08/25/2017 13:07     Assessment/Plan 1. AAA (abdominal aortic aneurysm) without rupture [facility](HCC)  - Ambulatory referral to Cardiology  Recommend:   The aneurysm is > 5 cm and therefore should undergo repair. Patient is status post CT scan of the abdominal aorta. The patient is a candidate for endovascular repair.   He will require cardiac clearance.   The patient will continue antiplatelet therapy as prescribed (since the patient is undergoing endovascular repair as opposed to open repair) as well as aggressive management of hyperlipidemia. Exercise is again strongly encouraged.   The patient is reminded that lifetime routine surveillance is a necessity with an endograft.   The risks and benefits of AAA repair are reviewed with  the patient.  All questions are answered.  Alternative therapies are also discussed.  The patient agrees to proceed with endovascular aneurysm repair.  Patient will follow-up with me in the office after the surgery.  2. Atherosclerosis of native artery of both lower extremities with intermittent claudication (HCC)  Recommend:  The patient has evidence of atherosclerosis of the lower extremities with claudication.  The patient does not voice lifestyle limiting changes at this point in time.  Noninvasive studies do not suggest clinically significant change.  No invasive studies, angiography or surgery at this time The patient should continue walking and begin a more formal exercise program.  The patient should continue antiplatelet therapy and aggressive treatment of the lipid abnormalities  No changes in the patient's medications at this time  The patient should continue wearing graduated compression socks 10-15 mmHg strength to control the mild edema.    3. Coronary artery disease involving native coronary artery of native heart with angina pectoris (HCC) Continue cardiac and antihypertensive medications as already ordered and reviewed, no changes at this time.  Continue statin as ordered and reviewed, no changes at this time  Nitrates PRN for chest pain   4. Essential hypertension Continue antihypertensive medications as already ordered, these medications have been reviewed and there are no changes at this time.     Levora DredgeGregory Schnier, MD  09/04/2017 8:50 AM

## 2017-09-09 ENCOUNTER — Encounter (INDEPENDENT_AMBULATORY_CARE_PROVIDER_SITE_OTHER): Payer: Self-pay | Admitting: Vascular Surgery

## 2017-10-03 ENCOUNTER — Other Ambulatory Visit (INDEPENDENT_AMBULATORY_CARE_PROVIDER_SITE_OTHER): Payer: Self-pay | Admitting: Nurse Practitioner

## 2017-10-03 ENCOUNTER — Encounter: Payer: Self-pay | Admitting: Anesthesiology

## 2017-10-03 MED ORDER — CEFAZOLIN SODIUM-DEXTROSE 2-4 GM/100ML-% IV SOLN
2.0000 g | INTRAVENOUS | Status: AC
  Administered 2017-10-04: 2 g via INTRAVENOUS

## 2017-10-04 ENCOUNTER — Encounter: Payer: Self-pay | Admitting: Anesthesiology

## 2017-10-04 ENCOUNTER — Inpatient Hospital Stay: Payer: BLUE CROSS/BLUE SHIELD | Admitting: Anesthesiology

## 2017-10-04 ENCOUNTER — Inpatient Hospital Stay
Admission: RE | Admit: 2017-10-04 | Discharge: 2017-10-05 | DRG: 269 | Disposition: A | Discharge status: Home / Self Care | Payer: BLUE CROSS/BLUE SHIELD | Attending: Vascular Surgery | Admitting: Vascular Surgery

## 2017-10-04 ENCOUNTER — Encounter
Admission: RE | Disposition: A | Discharge status: Home / Self Care | Payer: Self-pay | Source: Ambulatory Visit | Attending: Vascular Surgery

## 2017-10-04 DIAGNOSIS — I10 Essential (primary) hypertension: Secondary | ICD-10-CM | POA: Diagnosis present

## 2017-10-04 DIAGNOSIS — Y712 Prosthetic and other implants, materials and accessory cardiovascular devices associated with adverse incidents: Secondary | ICD-10-CM | POA: Diagnosis present

## 2017-10-04 DIAGNOSIS — Z79899 Other long term (current) drug therapy: Secondary | ICD-10-CM

## 2017-10-04 DIAGNOSIS — Z823 Family history of stroke: Secondary | ICD-10-CM | POA: Diagnosis not present

## 2017-10-04 DIAGNOSIS — E785 Hyperlipidemia, unspecified: Secondary | ICD-10-CM | POA: Diagnosis present

## 2017-10-04 DIAGNOSIS — I714 Abdominal aortic aneurysm, without rupture, unspecified: Secondary | ICD-10-CM | POA: Diagnosis present

## 2017-10-04 DIAGNOSIS — T82898A Other specified complication of vascular prosthetic devices, implants and grafts, initial encounter: Principal | ICD-10-CM | POA: Diagnosis present

## 2017-10-04 DIAGNOSIS — I25119 Atherosclerotic heart disease of native coronary artery with unspecified angina pectoris: Secondary | ICD-10-CM | POA: Diagnosis present

## 2017-10-04 DIAGNOSIS — I70213 Atherosclerosis of native arteries of extremities with intermittent claudication, bilateral legs: Secondary | ICD-10-CM | POA: Diagnosis present

## 2017-10-04 HISTORY — PX: ENDOVASCULAR REPAIR/STENT GRAFT: CATH118280

## 2017-10-04 LAB — MRSA PCR SCREENING: MRSA by PCR: NEGATIVE

## 2017-10-04 LAB — TYPE AND SCREEN
ABO/RH(D): O POS
Antibody Screen: NEGATIVE

## 2017-10-04 LAB — BASIC METABOLIC PANEL
Anion gap: 9 (ref 5–15)
BUN: 26 mg/dL — ABNORMAL HIGH (ref 8–23)
CALCIUM: 9.5 mg/dL (ref 8.9–10.3)
CO2: 29 mmol/L (ref 22–32)
Chloride: 104 mmol/L (ref 98–111)
Creatinine, Ser: 1.13 mg/dL (ref 0.61–1.24)
Glucose, Bld: 124 mg/dL — ABNORMAL HIGH (ref 70–99)
Potassium: 3.9 mmol/L (ref 3.5–5.1)
SODIUM: 142 mmol/L (ref 135–145)

## 2017-10-04 LAB — CBC WITH DIFFERENTIAL/PLATELET
BASOS ABS: 0.1 10*3/uL (ref 0–0.1)
BASOS PCT: 1 %
Eosinophils Absolute: 0.3 10*3/uL (ref 0–0.7)
Eosinophils Relative: 3 %
HCT: 46.7 % (ref 40.0–52.0)
Hemoglobin: 15.9 g/dL (ref 13.0–18.0)
Lymphocytes Relative: 17 %
Lymphs Abs: 1.3 10*3/uL (ref 1.0–3.6)
MCH: 31.8 pg (ref 26.0–34.0)
MCHC: 33.9 g/dL (ref 32.0–36.0)
MCV: 93.6 fL (ref 80.0–100.0)
MONO ABS: 0.6 10*3/uL (ref 0.2–1.0)
Monocytes Relative: 7 %
Neutro Abs: 5.7 10*3/uL (ref 1.4–6.5)
Neutrophils Relative %: 72 %
PLATELETS: 203 10*3/uL (ref 150–440)
RBC: 4.99 MIL/uL (ref 4.40–5.90)
RDW: 13.8 % (ref 11.5–14.5)
WBC: 7.9 10*3/uL (ref 3.8–10.6)

## 2017-10-04 LAB — PROTIME-INR
INR: 0.96
Prothrombin Time: 12.7 seconds (ref 11.4–15.2)

## 2017-10-04 LAB — GLUCOSE, CAPILLARY: Glucose-Capillary: 120 mg/dL — ABNORMAL HIGH (ref 70–99)

## 2017-10-04 LAB — APTT: APTT: 30 s (ref 24–36)

## 2017-10-04 LAB — ABO/RH: ABO/RH(D): O POS

## 2017-10-04 SURGERY — ENDOVASCULAR REPAIR/STENT GRAFT
Anesthesia: General

## 2017-10-04 MED ORDER — OXYCODONE-ACETAMINOPHEN 5-325 MG PO TABS: 1.0000 | ORAL_TABLET | ORAL | Status: DC | PRN

## 2017-10-04 MED ORDER — DOPAMINE-DEXTROSE 3.2-5 MG/ML-% IV SOLN: 3.0000 ug/kg/min | INTRAVENOUS | Status: DC

## 2017-10-04 MED ORDER — LIDOCAINE HCL (CARDIAC) PF 100 MG/5ML IV SOSY
PREFILLED_SYRINGE | INTRAVENOUS | Status: DC | PRN
  Administered 2017-10-04: 100 mg via INTRAVENOUS

## 2017-10-04 MED ORDER — SUGAMMADEX SODIUM 200 MG/2ML IV SOLN
INTRAVENOUS | Status: DC | PRN
  Administered 2017-10-04: 200 mg via INTRAVENOUS

## 2017-10-04 MED ORDER — EPHEDRINE SULFATE 50 MG/ML IJ SOLN
INTRAMUSCULAR | Status: AC
  Filled 2017-10-04: qty 1

## 2017-10-04 MED ORDER — ONDANSETRON HCL 4 MG/2ML IJ SOLN: 4.0000 mg | Freq: Four times a day (QID) | INTRAMUSCULAR | Status: DC | PRN

## 2017-10-04 MED ORDER — LACTATED RINGERS IV SOLN
INTRAVENOUS | Status: DC
  Administered 2017-10-04: 11:00:00 via INTRAVENOUS

## 2017-10-04 MED ORDER — CEFAZOLIN SODIUM-DEXTROSE 2-4 GM/100ML-% IV SOLN
2.0000 g | Freq: Three times a day (TID) | INTRAVENOUS | Status: AC
  Administered 2017-10-04 – 2017-10-05 (×2): 2 g via INTRAVENOUS
  Filled 2017-10-04 (×2): qty 100

## 2017-10-04 MED ORDER — HYDRALAZINE HCL 20 MG/ML IJ SOLN
5.0000 mg | INTRAMUSCULAR | Status: DC | PRN
Start: 2017-10-04 — End: 2017-10-05

## 2017-10-04 MED ORDER — MORPHINE SULFATE (PF) 2 MG/ML IV SOLN: 2.0000 mg | INTRAVENOUS | Status: DC | PRN

## 2017-10-04 MED ORDER — FAMOTIDINE IN NACL 20-0.9 MG/50ML-% IV SOLN
20.0000 mg | Freq: Two times a day (BID) | INTRAVENOUS | Status: DC
Start: 2017-10-04 — End: 2017-10-05

## 2017-10-04 MED ORDER — ROCURONIUM BROMIDE 50 MG/5ML IV SOLN
INTRAVENOUS | Status: AC
  Filled 2017-10-04: qty 1

## 2017-10-04 MED ORDER — DOCUSATE SODIUM 100 MG PO CAPS: 100.0000 mg | ORAL_CAPSULE | Freq: Every day | ORAL | Status: DC

## 2017-10-04 MED ORDER — ROCURONIUM BROMIDE 100 MG/10ML IV SOLN
INTRAVENOUS | Status: DC | PRN
  Administered 2017-10-04: 10 mg via INTRAVENOUS
  Administered 2017-10-04: 30 mg via INTRAVENOUS
  Administered 2017-10-04: 50 mg via INTRAVENOUS

## 2017-10-04 MED ORDER — POTASSIUM CHLORIDE CRYS ER 20 MEQ PO TBCR: 20.0000 meq | EXTENDED_RELEASE_TABLET | Freq: Every day | ORAL | Status: DC | PRN

## 2017-10-04 MED ORDER — FENTANYL CITRATE (PF) 100 MCG/2ML IJ SOLN
INTRAMUSCULAR | Status: DC | PRN
  Administered 2017-10-04: 100 ug via INTRAVENOUS

## 2017-10-04 MED ORDER — DEXAMETHASONE SODIUM PHOSPHATE 10 MG/ML IJ SOLN
INTRAMUSCULAR | Status: DC | PRN
  Administered 2017-10-04: 10 mg via INTRAVENOUS

## 2017-10-04 MED ORDER — NITROGLYCERIN IN D5W 200-5 MCG/ML-% IV SOLN: 5.0000 ug/min | INTRAVENOUS | Status: DC

## 2017-10-04 MED ORDER — LABETALOL HCL 5 MG/ML IV SOLN: 10.0000 mg | INTRAVENOUS | Status: DC | PRN

## 2017-10-04 MED ORDER — PROPOFOL 10 MG/ML IV BOLUS
INTRAVENOUS | Status: DC | PRN
  Administered 2017-10-04: 150 mg via INTRAVENOUS

## 2017-10-04 MED ORDER — LIDOCAINE HCL (PF) 2 % IJ SOLN
INTRAMUSCULAR | Status: AC
  Filled 2017-10-04: qty 10

## 2017-10-04 MED ORDER — ONDANSETRON HCL 4 MG/2ML IJ SOLN: 4.0000 mg | Freq: Once | INTRAMUSCULAR | Status: DC | PRN

## 2017-10-04 MED ORDER — ACETAMINOPHEN 650 MG RE SUPP: 325.0000 mg | RECTAL | Status: DC | PRN

## 2017-10-04 MED ORDER — SUGAMMADEX SODIUM 200 MG/2ML IV SOLN
INTRAVENOUS | Status: AC
  Filled 2017-10-04: qty 2

## 2017-10-04 MED ORDER — HYDROMORPHONE HCL 1 MG/ML IJ SOLN: 1.0000 mg | Freq: Once | INTRAMUSCULAR | Status: DC | PRN

## 2017-10-04 MED ORDER — HEPARIN SODIUM (PORCINE) 1000 UNIT/ML IJ SOLN
INTRAMUSCULAR | Status: DC | PRN
  Administered 2017-10-04: 6000 [IU] via INTRAVENOUS

## 2017-10-04 MED ORDER — HEPARIN SODIUM (PORCINE) 1000 UNIT/ML IJ SOLN
INTRAMUSCULAR | Status: AC
  Filled 2017-10-04: qty 1

## 2017-10-04 MED ORDER — IOPAMIDOL (ISOVUE-300) INJECTION 61%
INTRAVENOUS | Status: DC | PRN
  Administered 2017-10-04: 55 mL via INTRA_ARTERIAL

## 2017-10-04 MED ORDER — MAGNESIUM SULFATE 2 GM/50ML IV SOLN: 2.0000 g | Freq: Every day | INTRAVENOUS | Status: DC | PRN

## 2017-10-04 MED ORDER — ALUM & MAG HYDROXIDE-SIMETH 200-200-20 MG/5ML PO SUSP
15.0000 mL | ORAL | Status: DC | PRN
  Filled 2017-10-04: qty 30

## 2017-10-04 MED ORDER — CLOPIDOGREL BISULFATE 75 MG PO TABS
75.0000 mg | ORAL_TABLET | Freq: Every day | ORAL | Status: DC
  Administered 2017-10-05: 75 mg via ORAL
  Filled 2017-10-04: qty 1

## 2017-10-04 MED ORDER — ALLOPURINOL 300 MG PO TABS
300.0000 mg | ORAL_TABLET | Freq: Every day | ORAL | Status: DC
  Administered 2017-10-04: 300 mg via ORAL
  Filled 2017-10-04: qty 1

## 2017-10-04 MED ORDER — METOPROLOL TARTRATE 5 MG/5ML IV SOLN: 2.0000 mg | INTRAVENOUS | Status: DC | PRN

## 2017-10-04 MED ORDER — ASPIRIN EC 81 MG PO TBEC
81.0000 mg | DELAYED_RELEASE_TABLET | Freq: Every day | ORAL | Status: DC
  Administered 2017-10-04 – 2017-10-05 (×2): 81 mg via ORAL
  Filled 2017-10-04 (×2): qty 1

## 2017-10-04 MED ORDER — LEVOTHYROXINE SODIUM 112 MCG PO TABS
112.0000 ug | ORAL_TABLET | Freq: Every day | ORAL | Status: DC
  Administered 2017-10-05: 112 ug via ORAL
  Filled 2017-10-04: qty 1

## 2017-10-04 MED ORDER — GUAIFENESIN-DM 100-10 MG/5ML PO SYRP
15.0000 mL | ORAL_SOLUTION | ORAL | Status: DC | PRN
  Filled 2017-10-04: qty 15

## 2017-10-04 MED ORDER — EPHEDRINE SULFATE 50 MG/ML IJ SOLN
INTRAMUSCULAR | Status: DC | PRN
  Administered 2017-10-04 (×3): 10 mg via INTRAVENOUS
  Administered 2017-10-04: 5 mg via INTRAVENOUS

## 2017-10-04 MED ORDER — FENTANYL CITRATE (PF) 100 MCG/2ML IJ SOLN
INTRAMUSCULAR | Status: AC
  Filled 2017-10-04: qty 2

## 2017-10-04 MED ORDER — ONDANSETRON HCL 4 MG/2ML IJ SOLN
INTRAMUSCULAR | Status: DC | PRN
  Administered 2017-10-04: 4 mg via INTRAVENOUS

## 2017-10-04 MED ORDER — PROPOFOL 10 MG/ML IV BOLUS
INTRAVENOUS | Status: AC
  Filled 2017-10-04: qty 20

## 2017-10-04 MED ORDER — ACETAMINOPHEN 325 MG PO TABS
325.0000 mg | ORAL_TABLET | ORAL | Status: DC | PRN
  Administered 2017-10-05: 650 mg via ORAL
  Filled 2017-10-04: qty 2

## 2017-10-04 MED ORDER — SODIUM CHLORIDE 0.9 % IV SOLN: INTRAVENOUS | Status: DC

## 2017-10-04 MED ORDER — SODIUM CHLORIDE 0.9 % IV SOLN: 500.0000 mL | Freq: Once | INTRAVENOUS | Status: DC | PRN

## 2017-10-04 MED ORDER — BISOPROLOL-HYDROCHLOROTHIAZIDE 10-6.25 MG PO TABS
1.0000 | ORAL_TABLET | Freq: Every day | ORAL | Status: DC
  Administered 2017-10-04: 1 via ORAL
  Filled 2017-10-04 (×2): qty 1

## 2017-10-04 MED ORDER — PHENOL 1.4 % MT LIQD
1.0000 | OROMUCOSAL | Status: DC | PRN
  Filled 2017-10-04: qty 177

## 2017-10-04 MED ORDER — DEXAMETHASONE SODIUM PHOSPHATE 10 MG/ML IJ SOLN
INTRAMUSCULAR | Status: AC
  Filled 2017-10-04: qty 1

## 2017-10-04 MED ORDER — ONDANSETRON HCL 4 MG/2ML IJ SOLN
INTRAMUSCULAR | Status: AC
  Filled 2017-10-04: qty 2

## 2017-10-04 MED ORDER — FENTANYL CITRATE (PF) 100 MCG/2ML IJ SOLN: 25.0000 ug | INTRAMUSCULAR | Status: DC | PRN

## 2017-10-04 SURGICAL SUPPLY — 56 items
BALLN MUSTANG 12X60X75 (BALLOONS) ×2
BALLN ULTRVRSE 12X60X75 (BALLOONS) ×2
BALLN ULTRVRSE 8X40X75C (BALLOONS) ×2
BALLOON MUSTANG 12X60X75 (BALLOONS) ×1 IMPLANT
BALLOON ULTRVRSE 12X60X75 (BALLOONS) ×1 IMPLANT
BALLOON ULTRVRSE 8X40X75C (BALLOONS) ×1 IMPLANT
BLADE SURG SZ11 CARB STEEL (BLADE) ×2 IMPLANT
BOOT SUTURE AID YELLOW STND (SUTURE) ×2 IMPLANT
CATH ACCU-VU SIZ PIG 5F 70CM (CATHETERS) ×2 IMPLANT
CATH BALLN CODA 9X100X32 (BALLOONS) ×2 IMPLANT
CATH BEACON 5 .035 65 C2 TIP (CATHETERS) ×2 IMPLANT
CATH BEACON 5 .035 65 KMP TIP (CATHETERS) ×2 IMPLANT
CATH IMAGER II S 5FR 65CM (CATHETERS) ×2 IMPLANT
CATH VS15FR (CATHETERS) ×2 IMPLANT
COVER DRAPE FLUORO 36X44 (DRAPES) IMPLANT
DERMABOND ADVANCED (GAUZE/BANDAGES/DRESSINGS) ×1
DERMABOND ADVANCED .7 DNX12 (GAUZE/BANDAGES/DRESSINGS) ×1 IMPLANT
DEVICE CLOSURE PERCLS PRGLD 6F (VASCULAR PRODUCTS) ×6 IMPLANT
DEVICE PRESTO INFLATION (MISCELLANEOUS) ×4 IMPLANT
DEVICE SAFEGUARD 24CM (GAUZE/BANDAGES/DRESSINGS) ×4 IMPLANT
DEVICE TORQUE .025-.038 (MISCELLANEOUS) ×2 IMPLANT
DRYSEAL FLEXSHEATH 14FR 33CM (SHEATH) ×1
DRYSEAL FLEXSHEATH 16FR 33CM (SHEATH) ×1
ELECT REM PT RETURN 9FT ADLT (ELECTROSURGICAL) ×4
ELECTRODE REM PT RTRN 9FT ADLT (ELECTROSURGICAL) ×2 IMPLANT
EXCLUDER TNK 23X14.5MMX12CM (Endovascular Graft) ×1 IMPLANT
EXCLUDER TRUNK 23X14.5MMX12CM (Endovascular Graft) ×2 IMPLANT
GLIDEWIRE STIFF .35X180X3 HYDR (WIRE) ×2 IMPLANT
GLOVE BIO SURGEON STRL SZ7 (GLOVE) ×2 IMPLANT
GLOVE SURG SYN 8.0 (GLOVE) ×2 IMPLANT
GOWN STRL REUS W/ TWL LRG LVL3 (GOWN DISPOSABLE) ×1 IMPLANT
GOWN STRL REUS W/ TWL XL LVL3 (GOWN DISPOSABLE) ×1 IMPLANT
GOWN STRL REUS W/TWL LRG LVL3 (GOWN DISPOSABLE) ×1
GOWN STRL REUS W/TWL XL LVL3 (GOWN DISPOSABLE) ×1
IV NS 1000ML (IV SOLUTION) ×1
IV NS 1000ML BAXH (IV SOLUTION) ×1 IMPLANT
LEG CONTRALATERAL 23X14 (Endovascular Graft) ×2 IMPLANT
LOOP RED MAXI  1X406MM (MISCELLANEOUS) ×1
LOOP VESSEL MAXI 1X406 RED (MISCELLANEOUS) ×1 IMPLANT
LOOP VESSEL MINI 0.8X406 BLUE (MISCELLANEOUS) ×1 IMPLANT
LOOPS BLUE MINI 0.8X406MM (MISCELLANEOUS) ×1
NEEDLE ENTRY 21GA 7CM ECHOTIP (NEEDLE) ×2 IMPLANT
PACK ANGIOGRAPHY (CUSTOM PROCEDURE TRAY) ×2 IMPLANT
PACK BASIN MAJOR ARMC (MISCELLANEOUS) ×2 IMPLANT
PERCLOSE PROGLIDE 6F (VASCULAR PRODUCTS) ×12
SET INTRO CAPELLA COAXIAL (SET/KITS/TRAYS/PACK) ×2 IMPLANT
SHEATH BRITE TIP 6FRX11 (SHEATH) ×4 IMPLANT
SHEATH BRITE TIP 8FRX11 (SHEATH) ×4 IMPLANT
SHEATH DRYSEAL FLEX 14FR 33CM (SHEATH) ×1 IMPLANT
SHEATH DRYSEAL FLEX 16FR 33CM (SHEATH) ×1 IMPLANT
SHIELD X-DRAPE GOLD 12X17 (MISCELLANEOUS) ×4 IMPLANT
SUT MNCRL+ 5-0 UNDYED PC-3 (SUTURE) ×2 IMPLANT
SUT MONOCRYL 5-0 (SUTURE) ×2
TUBING CONTRAST HIGH PRESS 72 (TUBING) ×2 IMPLANT
WIRE AMPLATZ SSTIFF .035X260CM (WIRE) ×4 IMPLANT
WIRE J 3MM .035X145CM (WIRE) ×4 IMPLANT

## 2017-10-04 NOTE — Anesthesia Post-op Follow-up Note (Signed)
Anesthesia QCDR form completed.        

## 2017-10-04 NOTE — Anesthesia Procedure Notes (Signed)
Procedure Name: Intubation Date/Time: 10/04/2017 12:14 PM Performed by: Eben Burow, CRNA Pre-anesthesia Checklist: Patient identified, Emergency Drugs available, Suction available, Patient being monitored and Timeout performed Patient Re-evaluated:Patient Re-evaluated prior to induction Oxygen Delivery Method: Circle system utilized Preoxygenation: Pre-oxygenation with 100% oxygen Induction Type: IV induction Ventilation: Mask ventilation without difficulty Laryngoscope Size: Mac and 4 Grade View: Grade II Tube type: Oral Tube size: 7.5 mm Number of attempts: 2 (1 attempt by SRNA; OETT placed by Mare Ferrari CRNA) Airway Equipment and Method: Bougie stylet Placement Confirmation: ETT inserted through vocal cords under direct vision,  positive ETCO2 and breath sounds checked- equal and bilateral Secured at: 22 cm Tube secured with: Tape Dental Injury: Teeth and Oropharynx as per pre-operative assessment

## 2017-10-04 NOTE — Anesthesia Postprocedure Evaluation (Signed)
Anesthesia Post Note  Patient: Jeffery Giles  Procedure(s) Performed: ENDOVASCULAR REPAIR/STENT GRAFT (N/A )  Patient location during evaluation: Cath Lab Anesthesia Type: General Level of consciousness: awake and alert Pain management: pain level controlled Vital Signs Assessment: post-procedure vital signs reviewed and stable Respiratory status: spontaneous breathing, nonlabored ventilation, respiratory function stable and patient connected to nasal cannula oxygen Cardiovascular status: blood pressure returned to baseline and stable Postop Assessment: no apparent nausea or vomiting Anesthetic complications: no     Last Vitals:  Vitals:   10/04/17 1456 10/04/17 1519  BP: (!) 145/61 (!) 153/87  Pulse: 61 68  Resp: 15 (!) 29  Temp: 36.5 C (!) 36.4 C  SpO2: 99% 99%    Last Pain:  Vitals:   10/04/17 1519  TempSrc: Oral  PainSc:                  Kelcey Wickstrom S

## 2017-10-04 NOTE — Op Note (Addendum)
OPERATIVE NOTE   PROCEDURE: 1. US guidance for vascular access, bilateral femoral arteries 2. Catheter placement into aorta from bilateral femoral approaches 3. Placement of a C3 23 x 14 x 12 Gore Excluder Endoprosthesis main body with a 23 x 14 contralateral limb 4. ProGlide closure devices bilateral femoral arteries  PRE-OPERATIVE DIAGNOSIS: AAA; complication of vascular device with leaking type III endoleak from the remotely placed Endologix graft  POST-OPERATIVE DIAGNOSIS: same  SURGEON: Hortencia Pilar, MD and Leotis Pain, MD - Co-surgeons  ANESTHESIA: general  ESTIMATED BLOOD LOSS: 50 cc  FINDING(S): 1.  AAA  SPECIMEN(S):  none  INDICATIONS:   Jeffery Giles is a 72 y.o. y.o. male who presents with reexpansion of his aneurysm several years after endovascular repair.  This is consistent with a type III endoleak from his Endologix graft.  Repair will be done endovascularly by relining the existing stent graft with a new Gore Excluder stent graft.  The risks and benefits were reviewed with the patient all questions were answered he agrees to proceed.  DESCRIPTION: After obtaining full informed written consent, the patient was brought back to the operating room and placed supine upon the operating table.  The patient received IV antibiotics prior to induction.  After obtaining adequate anesthesia, the patient was prepped and draped in the standard fashion for endovascular AAA repair.  Co-surgeons are required because this is a bilateral procedure with work being performed simultaneously from both the right femoral and left femoral approach.  This also expedites the procedure making a shorter operative time reducing complications and improving patient safety.  We then began by gaining access to both femoral arteries with US guidance with me working on the the patient's right and Dr. Lucky Cowboy working on the the patient's left.  The femoral arteries were found to be patent and accessed  without difficulty with a needle under ultrasound guidance without difficulty on each side and permanent images were recorded.  We then placed 2 proglide devices on each side in a pre-close fashion and placed 8 French sheaths.  The patient was then given 6000 units of intravenous heparin.   Given the existing Endologix graft and the construction that has billowing of the fabric away from the stent which allows a wire and catheter to pass behind the stent and become entangled with the Endologix graft simply advancing the Pigtail catheter over a wire was not adequate.  Therefore, a contralateral catheter was reformed in the iliac limb within the via bond and then advanced into the Endologix graft using the curve to prevent weaving of the wire within the stent interstices.  Once the contralateral catheter was above the graft the Amplatz Super Stiff wire was advanced through the catheter and then the catheter was removed.  An 8 mm x 4 cm balloon was then inflated in the suprarenal position and pulled back through the graft along the wire.  At this point the balloon passed without any restrictions indicating that we were in the true lumen that there was no impedance to withdrawing the balloon because it was tangled within the struts of the existing stent graft.  Of note this similar process was performed from the left side again ensuring that we did not we have the wire within the interstices on the left as we advanced our sheath and catheters.  The pigtail catheter was then placed into the aorta from the right side. Using this image, we selected a 23 x 14 x 12 Main body device.  Over a stiff wire, an 16 French sheath was placed up the right. The main body was then placed through the 16 French sheath. A Kumpe catheter was placed up the left side and a magnified image at the renal arteries was performed. The main body was then deployed just below the lowest renal artery. The C2 catheter with a Glidewire was used to  cannulate the contralateral gate without difficulty and successful cannulation was confirmed by twirling the pigtail catheter in the main body. We then placed a stiff wire and a retrograde arteriogram was performed through the left femoral sheath. We upsized to the 12 [place] sheath for the contralateral limb and a 23 x 14 limb was selected and deployed. The main body deployment was then completed. Based off the angiographic findings, extension limbs were not necessary.  All junction points and seals zones were treated with the compliant balloon.  Following treating the entire length of the new core stent graft from both right and left sides with the Coda balloon to 12 mm x 6 cm Ultraverse balloons were opened onto the field and angioplasty was performed from the flow divider down to the iliac limbs.  3 separate inflations were performed bilaterally simultaneously all were to 10 atm for 30 seconds to 1 minute.  The pigtail catheter was then replaced and a completion angiogram was performed.   No endoleak was detected on completion angiography. The renal arteries were found to be widely patent.  Previous endoleak has now resolved.   At this point we elected to terminate the procedure. We secured the pro glide devices for hemostasis on the femoral arteries. The skin incision was closed with a 4-0 Monocryl. Dermabond and pressure dressing were placed. The patient was taken to the recovery room in stable condition having tolerated the procedure well.  COMPLICATIONS: none  CONDITION: stable  Hortencia Pilar  10/04/2017, 2:11 PM

## 2017-10-04 NOTE — Anesthesia Preprocedure Evaluation (Signed)
Anesthesia Evaluation  Patient identified by MRN, date of birth, ID band Patient awake    Reviewed: Allergy & Precautions, NPO status , Patient's Chart, lab work & pertinent test results, reviewed documented beta blocker date and time   Airway Mallampati: III  TM Distance: >3 FB     Dental  (+) Chipped   Pulmonary           Cardiovascular hypertension, Pt. on medications and Pt. on home beta blockers + CAD and + Peripheral Vascular Disease       Neuro/Psych    GI/Hepatic   Endo/Other  Hypothyroidism   Renal/GU      Musculoskeletal   Abdominal   Peds  Hematology   Anesthesia Other Findings Gout.Last Hb 11.2.  Reproductive/Obstetrics                             Anesthesia Physical Anesthesia Plan  ASA: III  Anesthesia Plan: General   Post-op Pain Management:    Induction: Intravenous  PONV Risk Score and Plan:   Airway Management Planned: Oral ETT  Additional Equipment:   Intra-op Plan:   Post-operative Plan:   Informed Consent: I have reviewed the patients History and Physical, chart, labs and discussed the procedure including the risks, benefits and alternatives for the proposed anesthesia with the patient or authorized representative who has indicated his/her understanding and acceptance.     Plan Discussed with: CRNA  Anesthesia Plan Comments:         Anesthesia Quick Evaluation

## 2017-10-04 NOTE — H&P (Signed)
[facility] History & Physical Update  The patient was interviewed and re-examined.  The patient's previous History and Physical has been reviewed and is unchanged.  There is no change in the plan of care. We plan to proceed with the scheduled procedure.  Levora DredgeGregory Schnier, MD  10/04/2017, 11:14 AM

## 2017-10-04 NOTE — Transfer of Care (Signed)
Immediate Anesthesia Transfer of Care Note  Patient: Jeffery PhoenixCharles E Smylie  Procedure(s) Performed: ENDOVASCULAR REPAIR/STENT GRAFT (N/A )  Patient Location: PACU  Anesthesia Type:General  Level of Consciousness: awake, alert , oriented and patient cooperative  Airway & Oxygen Therapy: Patient Spontanous Breathing and Patient connected to face mask oxygen  Post-op Assessment: Report given to RN and Post -op Vital signs reviewed and stable  Post vital signs: Reviewed and stable  Last Vitals:  Vitals Value Taken Time  BP 152/89 10/04/2017  2:26 PM  Temp    Pulse 71 10/04/2017  2:34 PM  Resp 20 10/04/2017  2:34 PM  SpO2 99 % 10/04/2017  2:34 PM  Vitals shown include unvalidated device data.  Last Pain:  Vitals:   10/04/17 1426  TempSrc:   PainSc: (P) 0-No pain         Complications: No apparent anesthesia complications

## 2017-10-04 NOTE — Op Note (Signed)
OPERATIVE NOTE   PROCEDURE: 1. US guidance for vascular access, bilateral femoral arteries 2. Catheter placement into aorta from bilateral femoral approaches 3. Placement of a 23 mm proximal, 14 mm distal, 12 cm length Gore Excluder Endoprosthesis main body right with a 23 mm diameter by 14 cm length left contralateral limb 4. ProGlide closure devices bilateral femoral arteries  PRE-OPERATIVE DIAGNOSIS: AAA  POST-OPERATIVE DIAGNOSIS: same  SURGEON: Festus BarrenJason Dew, MD and Levora DredgeGregory Schnier, MD - Co-surgeons  ANESTHESIA: General  ESTIMATED BLOOD LOSS: 50 cc  FINDING(S): 1.  AAA  SPECIMEN(S):  none  INDICATIONS:   Jeffery Giles is a 72 y.o. male who presents with a significant endoleak after a previous Endologix endovascular aneurysm repair and growth of his aneurysm sac.  He needs this repaired to prevent lethal rupture.  Using a different device, the anatomy was suitable for endovascular repair.  Risks and benefits of repair in an endovascular fashion were discussed and informed consent was obtained. Co-surgeons are used to expedite the procedure and reduce operative time as bilateral work needs to be done.  DESCRIPTION: After obtaining full informed written consent, the patient was brought back to the operating room and placed supine upon the operating table.  The patient received IV antibiotics prior to induction.  After obtaining adequate anesthesia, the patient was prepped and draped in the standard fashion for endovascular AAA repair.  We then began by gaining access to both femoral arteries with US guidance with me working on the left and Dr. Gilda CreaseSchnier working on the right.  The femoral arteries were found to be patent and accessed without difficulty with a needle under ultrasound guidance without difficulty on each side and permanent images were recorded.  We then placed 2 proglide devices on each side in a pre-close fashion and placed 8 French sheaths. The patient was then given  5000 units of intravenous heparin. The Pigtail catheter was placed into the aorta from the right side.  It should be noted that we advanced into the aorta using a contra catheter to avoid weaving through the interstices of the Endologix device from both sides, the right side first.  We also used an 8 mm balloon pulling this down over the wire to ensure that we were not woven through the interstices.  Using this image, we selected a 23 mm diameter by 12 cm length Main body device.  Over a stiff wire, an 16 French sheath was placed up the right. The main body was then placed through the 16 French sheath. A contra catheter was placed up the left side and a magnified image at the renal arteries was performed. The main body was then deployed just below the lowest renal artery. The Kumpe and then a C2 catheter was used to cannulate the contralateral gate with a mild amount of difficulty and successful cannulation was confirmed by twirling the pigtail catheter in the main body. We then placed a stiff wire and a retrograde arteriogram was performed through the left femoral sheath. We upsized to the 14 [place] sheath for the contralateral limb on the left and a 23 mm diameter by 14 cm length left iliac limb was selected and deployed. The main body deployment was then completed. Based off the angiographic findings, extension limbs were not necessary. All junction points and seals zones were treated with the compliant balloon. The pigtail catheter was then replaced and a completion angiogram was performed.  No obvious Endoleak was detected on completion angiography. The renal arteries  were found to be widely patent.  The left hypogastric artery remained widely patent.  The right hypogastric artery had been previously coil embolized. At this point we elected to terminate the procedure. We secured the pro glide devices for hemostasis on the femoral arteries.  Two additional pro-glide device was necessary on the left side but  hemostasis was achieved.  The skin incision was closed with a 4-0 Monocryl. Dermabond and pressure dressing were placed. The patient was taken to the recovery room in stable condition having tolerated the procedure well.  COMPLICATIONS: none  CONDITION: stable  Festus Barren  10/04/2017, 2:09 PM   This note was created with Dragon Medical transcription system. Any errors in dictation are purely unintentional.

## 2017-10-05 ENCOUNTER — Encounter: Payer: Self-pay | Admitting: Vascular Surgery

## 2017-10-05 LAB — CBC
HEMATOCRIT: 43.8 % (ref 40.0–52.0)
Hemoglobin: 14.8 g/dL (ref 13.0–18.0)
MCH: 32.2 pg (ref 26.0–34.0)
MCHC: 33.8 g/dL (ref 32.0–36.0)
MCV: 95.2 fL (ref 80.0–100.0)
Platelets: 184 10*3/uL (ref 150–440)
RBC: 4.6 MIL/uL (ref 4.40–5.90)
RDW: 13.9 % (ref 11.5–14.5)
WBC: 11.2 10*3/uL — ABNORMAL HIGH (ref 3.8–10.6)

## 2017-10-05 LAB — MAGNESIUM: Magnesium: 2 mg/dL (ref 1.7–2.4)

## 2017-10-05 LAB — BASIC METABOLIC PANEL
Anion gap: 6 (ref 5–15)
BUN: 24 mg/dL — ABNORMAL HIGH (ref 8–23)
CHLORIDE: 103 mmol/L (ref 98–111)
CO2: 31 mmol/L (ref 22–32)
CREATININE: 1.05 mg/dL (ref 0.61–1.24)
Calcium: 9.4 mg/dL (ref 8.9–10.3)
GFR calc non Af Amer: >60 mL/min (ref >60)
GLUCOSE: 168 mg/dL — AB (ref 70–99)
Potassium: 5.4 mmol/L — ABNORMAL HIGH (ref 3.5–5.1)
Sodium: 140 mmol/L (ref 135–145)

## 2017-10-05 LAB — POTASSIUM: POTASSIUM: 4.2 mmol/L (ref 3.5–5.1)

## 2017-10-05 MED ORDER — FAMOTIDINE 20 MG PO TABS: 20.0000 mg | ORAL_TABLET | Freq: Two times a day (BID) | ORAL | Status: DC

## 2017-10-05 MED ORDER — DEXTROSE 50 % IV SOLN
1.0000 | Freq: Once | INTRAVENOUS | Status: AC
  Administered 2017-10-05: 50 mL via INTRAVENOUS
  Filled 2017-10-05: qty 50

## 2017-10-05 MED ORDER — FUROSEMIDE 10 MG/ML IJ SOLN
40.0000 mg | Freq: Once | INTRAMUSCULAR | Status: AC
  Administered 2017-10-05: 40 mg via INTRAVENOUS
  Filled 2017-10-05: qty 4

## 2017-10-05 MED ORDER — DEXTROSE 50 % IV SOLN
INTRAVENOUS | Status: AC
  Filled 2017-10-05: qty 50

## 2017-10-05 MED ORDER — SODIUM CHLORIDE 0.9 % IV BOLUS
250.0000 mL | Freq: Once | INTRAVENOUS | Status: AC
  Administered 2017-10-05: 250 mL via INTRAVENOUS

## 2017-10-05 MED ORDER — INSULIN ASPART 100 UNIT/ML IV SOLN
5.0000 [IU] | Freq: Once | INTRAVENOUS | Status: AC
  Administered 2017-10-05: 5 [IU] via INTRAVENOUS
  Filled 2017-10-05: qty 0.05

## 2017-10-05 NOTE — Care Management (Signed)
No discharge needs identified by members of the care team 

## 2017-10-05 NOTE — Progress Notes (Signed)
PHARMACIST - PHYSICIAN COMMUNICATION  CONCERNING: IV to Oral Route Change Policy  RECOMMENDATION: This patient is receiving famotidine by the intravenous route.  Based on criteria proved by the Pharmacy and Therapeutics Committee, the intravenous medication(s) is/are being converted to the equivalent oral dose form(s).   DESCRIPTION: These criteria include:  The patient is eating (either orally or via tube) and/or has been taking other orally administered medications for a least 24 hours  The patient has no evidence of active gastrointestinal bleeding or impaired GI absorption (gastrectomy, short bowel, patient on TNA or NPO).  If you have questions about this conversion, please contact the Pharmacy Department  []   [phone]( [phone] )  [facility] [x]   [phone]( [phone] )  [facility] []   [phone]( [phone] )  [facility] []   [phone]( [phone] )  [facility] []   [phone]( [phone] )  [facility]   Simpson,Michael L, [facility] HospitalRPH 10/05/2017 9:11 AM

## 2017-10-05 NOTE — Progress Notes (Signed)
Tegaderm dressings placed on both groins, level 0. Pt with no complaints. Will continue to monitor.

## 2017-10-05 NOTE — Discharge Summary (Signed)
[facility]    Discharge Summary    Patient ID:  Jeffery Giles MRN: [id number] DOB/AGE: January 05, 1946 72 y.o.  Admit date: 10/04/2017 Discharge date: 10/05/2017 Date of Surgery: 10/04/2017 Surgeon: Surgeon(s): Naw Lasala, Latina Craver, MD  Admission Diagnosis: AAA (abdominal aortic aneurysm) [facility]) [I71.4]  Discharge Diagnoses:  AAA (abdominal aortic aneurysm) (HCC) [I71.4]  Secondary Diagnoses: Past Medical History:  Diagnosis Date  . Hypertension     Procedure(s): ENDOVASCULAR REPAIR/STENT GRAFT  Discharged Condition: good  HPI:  The patient presented yesterday for elective aneurysm repair using an endovascular stent graft.  The surgery was without complication.  He has had a normal postoperative course.  Today he denies pain he has ambulated and has now been able to urinate after the Foley has been removed.  He is fit for discharge.  Hospital Course:  Jeffery Giles is a 72 y.o. male is S/P Bilateral Procedure(s): ENDOVASCULAR REPAIR/STENT GRAFT Extubated: POD # 0 Physical exam: 4+ palpable posterior tibial and dorsalis pedis pulses Post-op wounds both groins are clean, dry, intact or healing well Pt. Ambulating, voiding and taking PO diet without difficulty. Pt pain controlled with PO pain meds. Labs as below Complications:none  Consults:    Significant Diagnostic Studies: CBC Lab Results  Component Value Date   WBC 11.2 (H) 10/05/2017   HGB 14.8 10/05/2017   HCT 43.8 10/05/2017   MCV 95.2 10/05/2017   PLT 184 10/05/2017    BMET    Component Value Date/Time   NA 140 10/05/2017 0501   NA 139 07/04/2013 0345   K 5.4 (H) 10/05/2017 0501   K 4.3 07/04/2013 0345   CL 103 10/05/2017 0501   CL 105 07/04/2013 0345   CO2 31 10/05/2017 0501   CO2 28 07/04/2013 0345   GLUCOSE 168 (H) 10/05/2017 0501   GLUCOSE 131 (H) 07/04/2013 0345   BUN 24 (H) 10/05/2017 0501   BUN 17 07/04/2013 0345   CREATININE 1.05 10/05/2017 0501   CREATININE 1.15 07/04/2013 0345   CALCIUM 9.4 10/05/2017 0501   CALCIUM 8.5 07/04/2013 0345   GFRNONAA >60 10/05/2017 0501   GFRNONAA >60 07/04/2013 0345   GFRAA >60 10/05/2017 0501   GFRAA >60 07/04/2013 0345   COAG Lab Results  Component Value Date   INR 0.96 10/04/2017   INR 1.1 07/04/2013     Disposition:  Discharge to :Home Discharge Instructions    Call MD for:  redness, tenderness, or signs of infection (pain, swelling, bleeding, redness, odor or green/yellow discharge around incision site)   Complete by:  As directed    Call MD for:  severe or increased pain, loss or decreased feeling  in affected limb(s)   Complete by:  As directed    Call MD for:  temperature >100.5   Complete by:  As directed    Discharge instructions   Complete by:  As directed    OK to shower   Driving Restrictions   Complete by:  As directed    No driving for 1 week   Lifting restrictions   Complete by:  As directed    No lifting for 2 weeks   No dressing needed   Complete by:  As directed    Replace only if drainage present   Resume previous diet   Complete by:  As directed      Allergies as of 10/05/2017   No Known Allergies     Medication List    TAKE these medications  allopurinol 300 MG tablet Commonly known as:  ZYLOPRIM Take 300 mg by mouth daily.   aspirin EC 81 MG tablet Take 81 mg by mouth daily.   bisoprolol-hydrochlorothiazide 10-6.25 MG tablet Commonly known as:  ZIAC Take 1 tablet by mouth daily.   indomethacin 50 MG capsule Commonly known as:  INDOCIN Take 50 mg by mouth 2 (two) times daily as needed (GOUT PAIN).   levothyroxine 112 MCG tablet Commonly known as:  SYNTHROID, LEVOTHROID Take 112 mcg by mouth daily before breakfast.      Verbal and written Discharge instructions given to the patient. Wound care per Discharge AVS Follow-up Information    Rosio Weiss, Latina CraverGregory G, MD Follow up in 2 week(s).   Specialties:  Vascular Surgery, Cardiology,  Radiology, Vascular Surgery Why:  no studies Contact information: [address] [place] [address] [phone]           Signed: Levora DredgeGregory Crysten Kaman, MD  10/05/2017, 12:03 PM

## 2017-10-19 ENCOUNTER — Encounter (INDEPENDENT_AMBULATORY_CARE_PROVIDER_SITE_OTHER): Payer: Self-pay | Admitting: Vascular Surgery

## 2017-10-19 ENCOUNTER — Ambulatory Visit (INDEPENDENT_AMBULATORY_CARE_PROVIDER_SITE_OTHER): Payer: BLUE CROSS/BLUE SHIELD | Admitting: Vascular Surgery

## 2017-10-19 VITALS — BP 130/68 | HR 62 | Resp 15 | Ht 72.0 in | Wt 204.0 lb

## 2017-10-19 DIAGNOSIS — I714 Abdominal aortic aneurysm, without rupture, unspecified: Secondary | ICD-10-CM

## 2017-10-19 NOTE — Progress Notes (Signed)
Patient ID: Jeffery Giles, male   DOB: 1945-03-05, 72 y.o.   MRN: [id number]  Chief Complaint  Patient presents with  . Follow-up    2 week [facility] no studies    HPI Jeffery Giles is a 72 y.o. male.   The patient returns to the office for surveillance of an abdominal aortic aneurysm status post stent graft placement on October 04, 2017.   PROCEDURE: 1. US guidance for vascular access, bilateral femoral arteries 2. Catheter placement into aorta from bilateral femoral approaches 3. Placement of a C3 23 x 14 x 12 Gore Excluder Endoprosthesis main body with a 23 x 14 contralateral limb 4. ProGlide closure devices bilateral femoral arteries  Patient denies abdominal pain or back pain, no other abdominal complaints. No groin related complaints. No symptoms consistent with distal embolization No changes in claudication distance.   There have been no interval changes in his overall healthcare since his last visit.   Patient denies amaurosis fugax or TIA symptoms. There is no history of claudication or rest pain symptoms of the lower extremities. The patient denies angina or shortness of breath.     Past Medical History:  Diagnosis Date  . Hypertension     Past Surgical History:  Procedure Laterality Date  . ABDOMINAL AORTIC ANEURYSM REPAIR    . ENDOVASCULAR REPAIR/STENT GRAFT N/A 10/04/2017   Procedure: ENDOVASCULAR REPAIR/STENT GRAFT;  Surgeon: Renford Dills, MD;  Location: [facility] INVASIVE CV LAB;  Service: Cardiovascular;  Laterality: N/A;  . HERNIA REPAIR        No Known Allergies  Current Outpatient Medications  Medication Sig Dispense Refill  . allopurinol (ZYLOPRIM) 300 MG tablet Take 300 mg by mouth daily.     Marland Kitchen aspirin EC 81 MG tablet Take 81 mg by mouth daily.     . bisoprolol-hydrochlorothiazide (ZIAC) 10-6.25 MG tablet Take 1 tablet by mouth daily.     . indomethacin (INDOCIN) 50 MG capsule Take 50 mg by mouth 2 (two) times daily as needed (GOUT PAIN).      Marland Kitchen levothyroxine (SYNTHROID, LEVOTHROID) 112 MCG tablet Take 112 mcg by mouth daily before breakfast.      No current facility-administered medications for this visit.         Physical Exam BP 130/68 (BP Location: Right Arm, Patient Position: Sitting)   Pulse 62   Resp 15   Ht 6' (1.829 m)   Wt 204 lb (92.5 kg)   BMI 27.67 kg/m  Gen:  WD/WN, NAD Skin: incision C/D/I, dorsalis pedis and posterior tibial pulses are 1+ bilaterally     Assessment/Plan: 1. AAA (abdominal aortic aneurysm) without rupture (HCC) Recommend: Patient is status post successful endovascular repair of the AAA.   No further intervention is required at this time.   No endoleak is detected and the aneurysm sac is stable.  The patient will continue antiplatelet therapy as prescribed as well as aggressive management of hyperlipidemia. Exercise is again strongly encouraged.   However, endografts require continued surveillance with ultrasound or CT scan. This is mandatory to detect any changes that allow repressurization of the aneurysm sac.  The patient is informed that this would be asymptomatic.  The patient is reminded that lifelong routine surveillance is a necessity with an endograft. Patient will continue to follow-up at 3 month intervals with ultrasound of the aorta.  - VAS US AORTA/IVC/ILIACS; Future      Levora Dredge 10/19/2017, 11:20 AM   This note was created with Reubin Milan  medical transcription system.  Any errors from dictation are unintentional.

## 2017-10-20 ENCOUNTER — Encounter (INDEPENDENT_AMBULATORY_CARE_PROVIDER_SITE_OTHER): Payer: Self-pay | Admitting: Vascular Surgery

## 2018-01-02 DIAGNOSIS — E7800 Pure hypercholesterolemia, unspecified: Secondary | ICD-10-CM | POA: Insufficient documentation

## 2018-01-22 ENCOUNTER — Encounter (INDEPENDENT_AMBULATORY_CARE_PROVIDER_SITE_OTHER): Payer: Self-pay | Admitting: Vascular Surgery

## 2018-01-22 ENCOUNTER — Ambulatory Visit (INDEPENDENT_AMBULATORY_CARE_PROVIDER_SITE_OTHER): Payer: BLUE CROSS/BLUE SHIELD | Admitting: Vascular Surgery

## 2018-01-22 ENCOUNTER — Ambulatory Visit (INDEPENDENT_AMBULATORY_CARE_PROVIDER_SITE_OTHER): Payer: BLUE CROSS/BLUE SHIELD

## 2018-01-22 VITALS — BP 147/85 | HR 56 | Resp 14 | Ht 72.0 in | Wt 211.2 lb

## 2018-01-22 DIAGNOSIS — I10 Essential (primary) hypertension: Secondary | ICD-10-CM | POA: Diagnosis not present

## 2018-01-22 DIAGNOSIS — I714 Abdominal aortic aneurysm, without rupture, unspecified: Secondary | ICD-10-CM

## 2018-01-22 DIAGNOSIS — I25119 Atherosclerotic heart disease of native coronary artery with unspecified angina pectoris: Secondary | ICD-10-CM | POA: Diagnosis not present

## 2018-01-22 DIAGNOSIS — I70213 Atherosclerosis of native arteries of extremities with intermittent claudication, bilateral legs: Secondary | ICD-10-CM | POA: Diagnosis not present

## 2018-01-22 DIAGNOSIS — T829XXD Unspecified complication of cardiac and vascular prosthetic device, implant and graft, subsequent encounter: Secondary | ICD-10-CM

## 2018-01-24 ENCOUNTER — Encounter (INDEPENDENT_AMBULATORY_CARE_PROVIDER_SITE_OTHER): Payer: Self-pay | Admitting: Vascular Surgery

## 2018-01-24 DIAGNOSIS — T829XXD Unspecified complication of cardiac and vascular prosthetic device, implant and graft, subsequent encounter: Secondary | ICD-10-CM | POA: Insufficient documentation

## 2018-01-24 NOTE — Progress Notes (Signed)
MRN : [id number]  Jeffery PhoenixCharles E Giles is a 73 y.o. (04/30/1945) male who presents with chief complaint of  Chief Complaint  Patient presents with  . Follow-up  .  History of Present Illness:   The patient returns to the office for surveillance of an abdominal aortic aneurysm status post stent graft placement on October 04, 2017.   PROCEDURE: 1. US guidance for vascular access, bilateral femoral arteries 2. Catheter placement into aorta from bilateral femoral approaches 3. Placement of aC3 23 x 14 x 12Gore Excluder Endoprosthesis main body with a 23 x 14contralateral limb 4. ProGlide closure devices bilateral femoral arteries  Patient denies abdominal pain or back pain, no other abdominal complaints. No groin related complaints. No symptoms consistent with distal embolization No changes in claudication distance.   There have been no interval changes in his overall healthcare since his last visit.   Patient denies amaurosis fugax or TIA symptoms. There is no history of claudication or rest pain symptoms of the lower extremities. The patient denies angina or shortness of breath.   2 days duplex ultrasound of the aorta iliac system shows a widely patent stent graft with triphasic signals throughout.  The maximal diameter of the aortic sac is 2.68 cm the maximal diameter of the right common iliac is 5.4 cm.  There is no evidence of endoleak.  Current Meds  Medication Sig  . allopurinol (ZYLOPRIM) 300 MG tablet Take 300 mg by mouth daily.   Marland Kitchen. aspirin EC 81 MG tablet Take 81 mg by mouth daily.   . bisoprolol-hydrochlorothiazide (ZIAC) 10-6.25 MG tablet Take 1 tablet by mouth daily.   Marland Kitchen. levothyroxine (SYNTHROID, LEVOTHROID) 112 MCG tablet Take 112 mcg by mouth daily before breakfast.   . lovastatin (MEVACOR) 10 MG tablet Take 1 tablet by mouth at bedtime.    Past Medical History:  Diagnosis Date  . Hypertension     Past Surgical History:  Procedure Laterality Date  .  ABDOMINAL AORTIC ANEURYSM REPAIR    . ENDOVASCULAR REPAIR/STENT GRAFT N/A 10/04/2017   Procedure: ENDOVASCULAR REPAIR/STENT GRAFT;  Surgeon: Renford DillsSchnier, Gregory G, MD;  Location: [facility];  Service: Cardiovascular;  Laterality: N/A;  . HERNIA REPAIR      Social History Social History   Tobacco Use  . Smoking status: Never Smoker  . Smokeless tobacco: Never Used  Substance Use Topics  . Alcohol use: Yes    Comment: ocassionally  . Drug use: No    Family History Family History  Problem Relation Age of Onset  . Stroke Father     No Known Allergies   REVIEW OF SYSTEMS (Negative unless checked)  Constitutional: [] Weight loss  [] Fever  [] Chills Cardiac: [] Chest pain   [] Chest pressure   [] Palpitations   [] Shortness of breath when laying flat   [] Shortness of breath with exertion. Vascular:  [] Pain in legs with walking   [] Pain in legs at rest  [] History of DVT   [] Phlebitis   [] Swelling in legs   [] Varicose veins   [] Non-healing ulcers Pulmonary:   [] Uses home oxygen   [] Productive cough   [] Hemoptysis   [] Wheeze  [] COPD   [] Asthma Neurologic:  [] Dizziness   [] Seizures   [] History of stroke   [] History of TIA  [] Aphasia   [] Vissual changes   [] Weakness or numbness in arm   [] Weakness or numbness in leg Musculoskeletal:   [] Joint swelling   [] Joint pain   [] Low back pain Hematologic:  [] Easy bruising  [] Easy bleeding   []   Hypercoagulable state   [] Anemic Gastrointestinal:  [] Diarrhea   [] Vomiting  [] Gastroesophageal reflux/heartburn   [] Difficulty swallowing. Genitourinary:  [] Chronic kidney disease   [] Difficult urination  [] Frequent urination   [] Blood in urine Skin:  [] Rashes   [] Ulcers  Psychological:  [] History of anxiety   []  History of major depression.  Physical Examination  Vitals:   01/22/18 1055  BP: (!) 147/85  Pulse: (!) 56  Resp: 14  Weight: 211 lb 3.2 oz (95.8 kg)  Height: 6' (1.829 m)   Body mass index is 28.64 kg/m. Gen: WD/WN, NAD Head: [place]/AT,  No temporalis wasting.  Ear/Nose/Throat: Hearing grossly intact, nares w/o erythema or drainage Eyes: PER, EOMI, sclera nonicteric.  Neck: Supple, no large masses.   Pulmonary:  Good air movement, no audible wheezing bilaterally, no use of accessory muscles.  Cardiac: RRR, no JVD Vascular: Palpable pulsatile mass right lower quadrant nontender Vessel Right Left  Radial Palpable Palpable  Brachial Palpable Palpable  Popliteal  not broadened palpable  not broadened palpable  PT Palpable Palpable  DP Palpable Palpable  Gastrointestinal: Non-distended. No guarding/no peritoneal signs.  Musculoskeletal: M/S 5/5 throughout.  No deformity or atrophy.  Neurologic: CN 2-12 intact. Symmetrical.  Speech is fluent. Motor exam as listed above. Psychiatric: Judgment intact, Mood & affect appropriate for pt's clinical situation. Dermatologic: No rashes or ulcers noted.  No changes consistent with cellulitis. Lymph : No lichenification or skin changes of chronic lymphedema.  CBC Lab Results  Component Value Date   WBC 11.2 (H) 10/05/2017   HGB 14.8 10/05/2017   HCT 43.8 10/05/2017   MCV 95.2 10/05/2017   PLT 184 10/05/2017    BMET    Component Value Date/Time   NA 140 10/05/2017 0501   NA 139 07/04/2013 0345   K 4.2 10/05/2017 1242   K 4.3 07/04/2013 0345   CL 103 10/05/2017 0501   CL 105 07/04/2013 0345   CO2 31 10/05/2017 0501   CO2 28 07/04/2013 0345   GLUCOSE 168 (H) 10/05/2017 0501   GLUCOSE 131 (H) 07/04/2013 0345   BUN 24 (H) 10/05/2017 0501   BUN 17 07/04/2013 0345   CREATININE 1.05 10/05/2017 0501   CREATININE 1.15 07/04/2013 0345   CALCIUM 9.4 10/05/2017 0501   CALCIUM 8.5 07/04/2013 0345   GFRNONAA >60 10/05/2017 0501   GFRNONAA >60 07/04/2013 0345   GFRAA >60 10/05/2017 0501   GFRAA >60 07/04/2013 0345   CrCl cannot be calculated (Patient's most recent lab result is older than the maximum 21 days allowed.).  COAG Lab Results  Component Value Date   INR 0.96  10/04/2017   INR 1.1 07/04/2013    Radiology No results found.   Assessment/Plan 1. AAA (abdominal aortic aneurysm) without rupture (HCC) Recommend: Patient is status post successful endovascular repair of the AAA.   No further intervention is required at this time.   No endoleak is detected and the aneurysm sac is stable.  The patient will continue antiplatelet therapy as prescribed as well as aggressive management of hyperlipidemia. Exercise is again strongly encouraged.   However, endografts require continued surveillance with ultrasound or CT scan. This is mandatory to detect any changes that allow repressurization of the aneurysm sac.  The patient is informed that this would be asymptomatic.  The patient is reminded that lifelong routine surveillance is a necessity with an endograft. Patient will continue to follow-up at 6 month intervals with ultrasound of the aorta. - VAS US AORTA/IVC/ILIACS; Future  2. Complication of vascular  device, subsequent encounter Recommend: Patient is status post successful endovascular repair of the AAA.   No further intervention is required at this time.   No endoleak is detected and the aneurysm sac is stable.  The patient will continue antiplatelet therapy as prescribed as well as aggressive management of hyperlipidemia. Exercise is again strongly encouraged.   However, endografts require continued surveillance with ultrasound or CT scan. This is mandatory to detect any changes that allow repressurization of the aneurysm sac.  The patient is informed that this would be asymptomatic.  The patient is reminded that lifelong routine surveillance is a necessity with an endograft. Patient will continue to follow-up at 6 month intervals with ultrasound of the aorta. - VAS US AORTA/IVC/ILIACS; Future  3. Atherosclerosis of native artery of both lower extremities with intermittent claudication (HCC)  Recommend:  The patient has evidence of  atherosclerosis of the lower extremities with claudication.  The patient does not voice lifestyle limiting changes at this point in time.  Noninvasive studies do not suggest clinically significant change.  No invasive studies, angiography or surgery at this time The patient should continue walking and begin a more formal exercise program.  The patient should continue antiplatelet therapy and aggressive treatment of the lipid abnormalities  No changes in the patient's medications at this time  The patient should continue wearing graduated compression socks 10-15 mmHg strength to control the mild edema.    4. Essential hypertension Continue antihypertensive medications as already ordered, these medications have been reviewed and there are no changes at this time.   5. Coronary artery disease involving native coronary artery of native heart with angina pectoris (HCC) Continue cardiac and antihypertensive medications as already ordered and reviewed, no changes at this time.  Continue statin as ordered and reviewed, no changes at this time  Nitrates PRN for chest pain     Levora Dredge, MD  01/24/2018 6:31 PM

## 2019-01-24 ENCOUNTER — Ambulatory Visit (INDEPENDENT_AMBULATORY_CARE_PROVIDER_SITE_OTHER): Payer: BC Managed Care – PPO | Admitting: Vascular Surgery

## 2019-01-24 ENCOUNTER — Encounter (INDEPENDENT_AMBULATORY_CARE_PROVIDER_SITE_OTHER): Payer: Self-pay | Admitting: Vascular Surgery

## 2019-01-24 ENCOUNTER — Ambulatory Visit (INDEPENDENT_AMBULATORY_CARE_PROVIDER_SITE_OTHER): Payer: BC Managed Care – PPO

## 2019-01-24 ENCOUNTER — Encounter (INDEPENDENT_AMBULATORY_CARE_PROVIDER_SITE_OTHER): Payer: Self-pay

## 2019-01-24 ENCOUNTER — Other Ambulatory Visit: Payer: Self-pay

## 2019-01-24 VITALS — BP 151/87 | HR 54 | Resp 18 | Ht 72.0 in | Wt 205.0 lb

## 2019-01-24 DIAGNOSIS — I714 Abdominal aortic aneurysm, without rupture, unspecified: Secondary | ICD-10-CM

## 2019-01-24 DIAGNOSIS — I872 Venous insufficiency (chronic) (peripheral): Secondary | ICD-10-CM | POA: Insufficient documentation

## 2019-01-24 DIAGNOSIS — E785 Hyperlipidemia, unspecified: Secondary | ICD-10-CM | POA: Insufficient documentation

## 2019-01-24 DIAGNOSIS — T829XXD Unspecified complication of cardiac and vascular prosthetic device, implant and graft, subsequent encounter: Secondary | ICD-10-CM

## 2019-01-24 DIAGNOSIS — G4733 Obstructive sleep apnea (adult) (pediatric): Secondary | ICD-10-CM | POA: Insufficient documentation

## 2019-01-24 DIAGNOSIS — I70213 Atherosclerosis of native arteries of extremities with intermittent claudication, bilateral legs: Secondary | ICD-10-CM | POA: Diagnosis not present

## 2019-01-24 DIAGNOSIS — I10 Essential (primary) hypertension: Secondary | ICD-10-CM

## 2019-01-24 DIAGNOSIS — K5790 Diverticulosis of intestine, part unspecified, without perforation or abscess without bleeding: Secondary | ICD-10-CM | POA: Insufficient documentation

## 2019-01-24 DIAGNOSIS — R739 Hyperglycemia, unspecified: Secondary | ICD-10-CM | POA: Insufficient documentation

## 2019-01-24 DIAGNOSIS — I25119 Atherosclerotic heart disease of native coronary artery with unspecified angina pectoris: Secondary | ICD-10-CM

## 2019-01-24 DIAGNOSIS — K6289 Other specified diseases of anus and rectum: Secondary | ICD-10-CM | POA: Insufficient documentation

## 2019-01-24 NOTE — Progress Notes (Signed)
MRN : [id number]  Jeffery Giles is a 74 y.o. ([birth year]) male who presents with chief complaint of follow up EVAR.  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 US guidance for vascular access, bilateral femoral arteries 2. Catheter placement into aorta from bilateral femoral approaches 3. Placement of aC3 23 x 14 x 12Gore Excluder Endoprosthesis main body with a 23 x 14contralateral limb 4. ProGlide closure devices bilateral femoral arteries  Patient denies abdominal pain or back pain, no other abdominal complaints. No groin related complaints. No symptoms consistent with distal embolization No changes in claudication distance.   There have been no interval changes in his overall healthcare since his last visit.  He does ask about his discoloration of his ankle.  This is painless.  Has been progressive with time.  No itching or burning associated with it.  No ulcerations associated.  He has not been wearing compression.  Patient denies amaurosis fugax or TIA symptoms. There is no history of claudication or rest pain symptoms of the lower extremities. The patient denies angina or shortness of breath.  2 days duplex ultrasound of the aorta iliac system shows a widely patent stent graft with triphasic signals throughout.  The maximal diameter of the aortic sac is 2.68 cm the maximal diameter of the right common iliac is 5.4 cm.  There is no evidence of endoleak.  No outpatient medications have been marked as taking for the 01/24/19 encounter (Appointment) with Gilda Crease, Latina Craver, MD.     Past Medical History:  Diagnosis Date  . Hypertension     Past Surgical History:  Procedure Laterality Date  . ABDOMINAL AORTIC ANEURYSM REPAIR    . ENDOVASCULAR REPAIR/STENT GRAFT N/A 10/04/2017   Procedure: ENDOVASCULAR REPAIR/STENT GRAFT;  Surgeon: Renford Dills, MD;  Location: [facility];  Service: Cardiovascular;  Laterality: N/A;  . HERNIA REPAIR      Social History Social History   Tobacco Use  . Smoking status: Never Smoker  . Smokeless tobacco: Never Used  Substance Use Topics  . Alcohol use: Yes    Comment: ocassionally  . Drug use: No    Family History Family History  Problem Relation Age of Onset  . Stroke Father     No Known Allergies   REVIEW OF SYSTEMS (Negative unless checked)  Constitutional: [] Weight loss  [] Fever  [] Chills Cardiac: [] Chest pain   [] Chest pressure   [] Palpitations   [] Shortness of breath when laying flat   [] Shortness of breath with exertion. Vascular:  [] Pain in legs with walking   [] Pain in legs at rest  [] History of DVT   [] Phlebitis   [] Swelling in legs   [] Varicose veins   [] Non-healing ulcers Pulmonary:   [] Uses home oxygen   [] Productive cough   [] Hemoptysis   [] Wheeze  [] COPD   [] Asthma Neurologic:  [] Dizziness   [] Seizures   [] History of stroke   [] History of TIA  [] Aphasia   [] Vissual changes   [] Weakness or numbness in arm   [] Weakness or numbness in leg Musculoskeletal:   [] Joint swelling   [x] Joint pain   [] Low  back pain Hematologic:  [] Easy bruising  [] Easy bleeding   [] Hypercoagulable state   [] Anemic Gastrointestinal:  [] Diarrhea   [] Vomiting  [] Gastroesophageal reflux/heartburn   [] Difficulty swallowing. Genitourinary:  [] Chronic kidney disease   [] Difficult urination  [] Frequent urination   [] Blood in urine Skin:  [] Rashes   [] Ulcers  Psychological:  [] History of anxiety   []  History of major depression.  Physical Examination  There were no vitals filed for this visit. There is no height or  weight on file to calculate BMI. Gen: WD/WN, NAD Head: [place]/AT, No temporalis wasting.  Ear/Nose/Throat: Hearing grossly intact, nares w/o erythema or drainage Eyes: PER, EOMI, sclera nonicteric.  Neck: Supple, no large masses.   Pulmonary:  Good air movement, no audible wheezing bilaterally, no use of accessory muscles.  Cardiac: RRR, no JVD Vascular: Moderate venous changes of the ankles with multiple small varicosities and bluish discoloration.  Skin is quite dry.  Popliteal pulses are not enlarged Vessel Right Left  Radial Palpable Palpable  Popliteal Palpable Palpable  PT Palpable Palpable  DP Palpable Palpable  Gastrointestinal: Non-distended. No guarding/no peritoneal signs.  Musculoskeletal: M/S 5/5 throughout.  No deformity or atrophy.  Neurologic: CN 2-12 intact. Symmetrical.  Speech is fluent. Motor exam as listed above. Psychiatric: Judgment intact, Mood & affect appropriate for pt's clinical situation. Dermatologic: Venous rashes no ulcers noted.  No changes consistent with cellulitis. Lymph : No lichenification or skin changes of chronic lymphedema.  CBC Lab Results  Component Value Date   WBC 11.2 (H) 10/05/2017   HGB 14.8 10/05/2017   HCT 43.8 10/05/2017   MCV 95.2 10/05/2017   PLT 184 10/05/2017    BMET    Component Value Date/Time   NA 140 10/05/2017 0501   NA 139 07/04/2013 0345   K 4.2 10/05/2017 1242   K 4.3 07/04/2013 0345   CL 103 10/05/2017 0501   CL 105 07/04/2013 0345   CO2 31 10/05/2017 0501   CO2 28 07/04/2013 0345   GLUCOSE 168 (H) 10/05/2017 0501   GLUCOSE 131 (H) 07/04/2013 0345   BUN 24 (H) 10/05/2017 0501   BUN 17 07/04/2013 0345   CREATININE 1.05 10/05/2017 0501   CREATININE 1.15 07/04/2013 0345   CALCIUM 9.4 10/05/2017 0501   CALCIUM 8.5 07/04/2013 0345   GFRNONAA >60 10/05/2017 0501   GFRNONAA >60 07/04/2013 0345   GFRAA >60 10/05/2017 0501   GFRAA >60 07/04/2013 0345   CrCl cannot be calculated (Patient's most recent lab result  is older than the maximum 21 days allowed.).  COAG Lab Results  Component Value Date   INR 0.96 10/04/2017   INR 1.1 07/04/2013    Radiology No results found.    Assessment/Plan 1. AAA (abdominal aortic aneurysm) without rupture (HCC) Recommend: Patient is status post successful endovascular repair of the AAA.   No further intervention is required at this time.   No endoleak is detected but the right common iliac aneurysm sac is slightly enlarged to 6 cm, the aortic aneurysm sac is stable.  The patient will continue antiplatelet therapy as prescribed as well as aggressive management of hyperlipidemia. Exercise is again strongly encouraged.   However, endografts require continued surveillance with ultrasound or CT scan. This is mandatory to detect any changes that allow repressurization of the aneurysm sac.  The patient is informed that this would be asymptomatic.  The patient is reminded that lifelong routine surveillance is a necessity with an endograft. Patient will continue to follow-up at 6 month intervals with  ultrasound of the aorta rather than 1 year given the slight increase in the common iliac.  - IVC/ILIACS DUPLEX; Future  2. Atherosclerosis of native artery of both lower extremities with intermittent claudication (HCC)  Recommend:  The patient has evidence of atherosclerosis of the lower extremities with claudication.  The patient does not voice lifestyle limiting changes at this point in time.  Patient notes he has been walking regularly without much difficulty until the weather became cold  No invasive studies, angiography or surgery at this time The patient should continue walking and begin a more formal exercise program.  The patient should continue antiplatelet therapy and aggressive treatment of the lipid abnormalities  No changes in the patient's medications at this time  The patient should continue wearing graduated compression socks 10-15 mmHg strength to  control the mild edema.    3. Chronic venous insufficiency No surgery or intervention at this point in time.  I have reviewed my discussion with the patient regarding venous insufficiency and why it causes symptoms. I have discussed with the patient the chronic skin changes that accompany venous insufficiency and the long term sequela such as ulceration. Patient will contnue wearing graduated compression stockings on a daily basis, as this has provided excellent control of his edema. The patient will put the stockings on first thing in the morning and removing them in the evening. The patient is reminded not to sleep in the stockings.  In addition, behavioral modification including elevation during the day will be initiated. Exercise is strongly encouraged.  4. Coronary artery disease involving native coronary artery of native heart with angina pectoris (HCC) Continue cardiac and antihypertensive medications as already ordered and reviewed, no changes at this time.  Continue statin as ordered and reviewed, no changes at this time  Nitrates PRN for chest pain   5. Essential hypertension Continue antihypertensive medications as already ordered, these medications have been reviewed and there are no changes at this time.    Levora Dredge, MD  01/24/2019 8:28 AM

## 2019-02-06 ENCOUNTER — Other Ambulatory Visit: Payer: Self-pay

## 2019-02-06 ENCOUNTER — Ambulatory Visit: Payer: BC Managed Care – PPO | Attending: Internal Medicine

## 2019-02-06 DIAGNOSIS — Z23 Encounter for immunization: Secondary | ICD-10-CM | POA: Insufficient documentation

## 2019-02-06 NOTE — Progress Notes (Signed)
   Covid-19 Vaccination Clinic  Name:  SHAQUEL CHAVOUS    MRN: [id number] DOB: 1946-01-08  02/06/2019  Mr. Reaume was observed post Covid-19 immunization for 15 minutes without incidence. He was provided with Vaccine Information Sheet and instruction to access the V-Safe system.   Mr. Snee was instructed to call 911 with any severe reactions post vaccine: Marland Kitchen Difficulty breathing  . Swelling of your face and throat  . A fast heartbeat  . A bad rash all over your body  . Dizziness and weakness    Immunizations Administered    Name Date Dose VIS Date Route   Pfizer COVID-19 Vaccine 02/06/2019  5:28 PM 0.3 mL 12/28/2018 Intramuscular   Manufacturer: [employer], [employer]   Lot: [id number]   NDC: [address]-9    2

## 2019-02-13 ENCOUNTER — Other Ambulatory Visit: Payer: Self-pay

## 2019-02-13 ENCOUNTER — Encounter: Payer: Self-pay | Admitting: *Deleted

## 2019-02-13 ENCOUNTER — Emergency Department: Payer: BC Managed Care – PPO

## 2019-02-13 ENCOUNTER — Emergency Department
Admission: EM | Admit: 2019-02-13 | Discharge: 2019-02-13 | Disposition: A | Discharge status: Home / Self Care | Payer: BC Managed Care – PPO | Attending: Student | Admitting: Student

## 2019-02-13 DIAGNOSIS — W108XXA Fall (on) (from) other stairs and steps, initial encounter: Secondary | ICD-10-CM | POA: Diagnosis not present

## 2019-02-13 DIAGNOSIS — Z7982 Long term (current) use of aspirin: Secondary | ICD-10-CM | POA: Insufficient documentation

## 2019-02-13 DIAGNOSIS — S022XXA Fracture of nasal bones, initial encounter for closed fracture: Secondary | ICD-10-CM | POA: Diagnosis not present

## 2019-02-13 DIAGNOSIS — Y92009 Unspecified place in unspecified non-institutional (private) residence as the place of occurrence of the external cause: Secondary | ICD-10-CM | POA: Insufficient documentation

## 2019-02-13 DIAGNOSIS — S0181XA Laceration without foreign body of other part of head, initial encounter: Secondary | ICD-10-CM

## 2019-02-13 DIAGNOSIS — S01112A Laceration without foreign body of left eyelid and periocular area, initial encounter: Secondary | ICD-10-CM | POA: Diagnosis not present

## 2019-02-13 DIAGNOSIS — Y9301 Activity, walking, marching and hiking: Secondary | ICD-10-CM | POA: Insufficient documentation

## 2019-02-13 DIAGNOSIS — Z79899 Other long term (current) drug therapy: Secondary | ICD-10-CM | POA: Diagnosis not present

## 2019-02-13 DIAGNOSIS — E039 Hypothyroidism, unspecified: Secondary | ICD-10-CM | POA: Diagnosis not present

## 2019-02-13 DIAGNOSIS — W19XXXA Unspecified fall, initial encounter: Secondary | ICD-10-CM

## 2019-02-13 DIAGNOSIS — I2510 Atherosclerotic heart disease of native coronary artery without angina pectoris: Secondary | ICD-10-CM | POA: Insufficient documentation

## 2019-02-13 DIAGNOSIS — S0990XA Unspecified injury of head, initial encounter: Secondary | ICD-10-CM | POA: Diagnosis present

## 2019-02-13 DIAGNOSIS — I10 Essential (primary) hypertension: Secondary | ICD-10-CM | POA: Insufficient documentation

## 2019-02-13 DIAGNOSIS — Y999 Unspecified external cause status: Secondary | ICD-10-CM | POA: Insufficient documentation

## 2019-02-13 MED ORDER — BACITRACIN ZINC 500 UNIT/GM EX OINT
TOPICAL_OINTMENT | Freq: Once | CUTANEOUS | Status: AC
  Administered 2019-02-13: 1 via TOPICAL
  Filled 2019-02-13: qty 0.9

## 2019-02-13 MED ORDER — LIDOCAINE-EPINEPHRINE 2 %-1:100000 IJ SOLN
20.0000 mL | Freq: Once | INTRAMUSCULAR | Status: DC
  Filled 2019-02-13: qty 1

## 2019-02-13 NOTE — Discharge Instructions (Signed)
Thank you for letting us take care of you in the emergency department today.   We have repaired your forehead cut (laceration) with absorbable stitches that do NOT need to be removed.  Please keep the area clean and dry for the next 24 hours.  After this you let soap and water wash over your cut, but do not scrub vigorously at it.  Apply an antibiotic ointment on it twice a day.  Please follow up with: - Your primary care doctor to review your ER visit and recheck your wound - ENT doctor for follow up of your broken nose  Please return to the ER for any new or worsening symptoms, such as headache, passing out, vision changes, further bleeding from you cut, redness/drainage from your cut, or any other signs/symptoms that are concerning to you.

## 2019-02-13 NOTE — ED Triage Notes (Signed)
Pt to triage via wheelchair.  Pt has a laceration above left eyebrow.  Pt fell on pavers after slipping down 2 steps on the patio today.  No loc  No vomiting.  No neck or back pain.  Swelling to nose.  Nose bled earlier, none now.  Abrasions to face.   Pt alert  Speech clear.

## 2019-02-13 NOTE — ED Provider Notes (Signed)
[facility] Emergency Department Provider Note  ____________________________________________   First MD Initiated Contact with Patient 02/13/19 2108     (approximate)  I have reviewed the triage vital signs and the nursing notes.  History  Chief Complaint Fall    HPI Jeffery Giles is a 74 y.o. male past medical history as below, who presents to the emergency department for a fall with resultant left forehead/eyebrow laceration and swelling about the nose.  Patient states he was walking down his patio steps, when he missed the bottom steps causing him to fall forward and hit his head.  He denies any preceding presyncopal symptoms.  He denies any related loss of consciousness.  He has an irregular, slowly bleeding laceration above his left eyebrow as well as swelling to his nose.  He reports a very dull forehead headache, aching, 5/10 in severity, no radiation, no alleviating or aggravating components.  He denies any post fall visual changes, nausea, vomiting, weakness, numbness, tingling.  He is not on any blood thinning medications aside from baby aspirin daily.  Last tetanus shot was last year.   Past Medical Hx Past Medical History:  Diagnosis Date  . Hypertension     Problem List Patient Active Problem List   Diagnosis Date Noted  . Hyperglycemia 01/24/2019  . Diverticular disease 01/24/2019  . Hyperlipidemia 01/24/2019  . Proctitis 01/24/2019  . Sleep apnea, obstructive 01/24/2019  . Chronic venous insufficiency 01/24/2019  . Complication of vascular device, subsequent encounter 01/24/2018  . Pure hypercholesterolemia 01/02/2018  . AAA (abdominal aortic aneurysm) (HCC) 10/04/2017  . Atherosclerosis of native arteries of extremity with intermittent claudication (HCC) 08/10/2016  . AAA (abdominal aortic aneurysm) without rupture (HCC) 08/10/2016  . Essential hypertension 08/10/2016  . Hypothyroidism 08/10/2016  . CAD (coronary artery  disease) 08/10/2016  . Vaccine counseling 12/22/2015  . Type 2 diabetes mellitus with hyperlipidemia (HCC) 11/26/2013  . History of abdominal aortic aneurysm 06/07/2013  . Gout 06/07/2013    Past Surgical Hx Past Surgical History:  Procedure Laterality Date  . ABDOMINAL AORTIC ANEURYSM REPAIR    . ENDOVASCULAR REPAIR/STENT GRAFT N/A 10/04/2017   Procedure: ENDOVASCULAR REPAIR/STENT GRAFT;  Surgeon: Renford Dills, MD;  Location: [facility] INVASIVE CV LAB;  Service: Cardiovascular;  Laterality: N/A;  . HERNIA REPAIR      Medications Prior to Admission medications   Medication Sig Start Date End Date Taking? Authorizing Provider  allopurinol (ZYLOPRIM) 300 MG tablet Take 300 mg by mouth daily.  04/12/16   [provider]  aspirin EC 81 MG tablet Take 81 mg by mouth daily.     [provider]  bisoprolol-hydrochlorothiazide (ZIAC) 10-6.25 MG tablet Take 1 tablet by mouth daily.  06/19/16   [provider]  levothyroxine (SYNTHROID, LEVOTHROID) 112 MCG tablet Take 112 mcg by mouth daily before breakfast.  08/10/16   [provider]  lovastatin (MEVACOR) 10 MG tablet Take 1 tablet by mouth at bedtime. 01/02/18 01/02/19  [provider]    Allergies Patient has no known allergies.  Family Hx Family History  Problem Relation Age of Onset  . Stroke Father     Social Hx Social History   Tobacco Use  . Smoking status: Never Smoker  . Smokeless tobacco: Never Used  Substance Use Topics  . Alcohol use: Not Currently    Comment: ocassionally  . Drug use: No     Review of Systems  Constitutional: Negative for fever, chills. + fall Eyes: Negative for  visual changes. ENT: + nose swelling Cardiovascular: Negative for chest pain. Respiratory: Negative for shortness of breath. Gastrointestinal: Negative for nausea, vomiting.  Genitourinary: Negative for dysuria. Musculoskeletal: Negative for leg swelling. Skin: + laceration Neurological:  Negative for headaches.   Physical Exam  Vital Signs: ED Triage Vitals  Enc Vitals Group     BP 02/13/19 1928 (!) 158/70     Pulse Rate 02/13/19 1928 62     Resp 02/13/19 1928 18     Temp 02/13/19 1928 97.7 F (36.5 C)     Temp Source 02/13/19 1928 Oral     SpO2 02/13/19 1928 95 %     Weight 02/13/19 1929 205 lb (93 kg)     Height 02/13/19 1929 6' (1.829 m)     Head Circumference --      Peak Flow --      Pain Score 02/13/19 1929 6     Pain Loc --      Pain Edu? --      Excl. in GC? --     Constitutional: Alert and oriented.  Head: Normocephalic.  Laceration with related hematoma just above the left eyebrow.  Laceration is approximately 4 cm in length, horizontal, irregular, slow oozing secondary to a small superficial arterial bleeding vessel. Eyes: Conjunctivae clear. Sclera anicteric.  EOMI.  PERRL.  No evidence of entrapment. Nose: Diffuse swelling about the nasal bridge with associated tenderness.  No epistaxis.  No nasal septal hematoma. Mouth/Throat: Wearing mask.  No intraoral or dental trauma.  Jaw is well aligned, no malocclusion. Neck: No stridor.  No midline CS tenderness. Cardiovascular: Normal rate, regular rhythm. Extremities well perfused. Respiratory: Normal respiratory effort.  Chest wall stable.  Gastrointestinal: Soft. Non-tender. Non-distended.  Musculoskeletal: No lower extremity edema. No deformities. Neurologic:  Normal speech and language. No gross focal neurologic deficits are appreciated. CN II-XII in tact. Ambulatory with steady gait.  Skin: Laceration as above.  Psychiatric: Mood and affect are appropriate for situation.    Radiology  CT head/max face/CS:  IMPRESSION:  1. No CT evidence for acute intracranial abnormality. Atrophy and  mild small vessel ischemic changes of the white matter.  2. Straightening of the cervical spine with 2 mm anterolisthesis C3  on C4, probably degenerative. No acute fracture is seen. Multiple  level  degenerative changes  3. Acute mildly comminuted and depressed bilateral nasal bone  fracture with overlying soft tissue swelling and forehead soft  tissue hematoma. No other acute facial bone fracture is visualized    Procedures  Procedure(s) performed (including critical care):  Marland KitchenMarland KitchenLaceration Repair  Date/Time: 02/13/2019 10:13 PM Performed by: Miguel Aschoff., MD Authorized by: Miguel Aschoff., MD   Consent:    Consent obtained:  Verbal   Consent given by:  Patient   Risks discussed:  Infection, pain, poor cosmetic result and poor wound healing Anesthesia (see MAR for exact dosages):    Anesthesia method:  Local infiltration   Local anesthetic:  Lidocaine 2% WITH epi Laceration details:    Location:  Face   Facial location: L forehead, just superior to the eyebrow.   Wound length (cm): 4 cm, horizontal, irregular. Repair type:    Repair type:  Intermediate Pre-procedure details:    Preparation:  Patient was prepped and draped in usual sterile fashion Exploration:    Hemostasis achieved with:  Epinephrine and direct pressure (figure 8 suture x 2)   Wound extent: no foreign bodies/material noted     Contaminated: no  Treatment:    Area cleansed with:  Betadine and saline   Amount of cleaning:  Standard Skin repair:    Repair method:  Sutures   Suture size:  4-0   Wound skin closure material used: Vicryl.   Suture technique: simple interrupted x 2, figure 8 x 2. Approximation:    Approximation:  Close Post-procedure details:    Dressing:  Antibiotic ointment   Patient tolerance of procedure:  Tolerated well, no immediate complications     Initial Impression / Assessment and Plan / ED Course  74 y.o. male who presents to the ED for fall with resultant forehead laceration and hematoma, as well as nasal swelling.   Imaging reveals bilateral nasal bone fractures, otherwise no other acute facial bone fractures, no acute CS fractures, no acute intracranial  abnormalities.  Tetanus is up-to-date.  Laceration repaired as above.  Discussed wound care, advised PCP follow-up for wound recheck in about 1 week, follow-up with ENT regarding his nasal bone fractures.  Patient voices understanding and is comfortable to plan and discharge.  Given return precautions.   Final Clinical Impression(s) / ED Diagnosis  Final diagnoses:  Fall, initial encounter  Closed fracture of nasal bone, initial encounter  Laceration of eyebrow and forehead, left, initial encounter       Note:  This document was prepared using Dragon voice recognition software and may include unintentional dictation errors.   Lilia Pro., MD 02/13/19 2215

## 2019-02-14 ENCOUNTER — Ambulatory Visit: Payer: BLUE CROSS/BLUE SHIELD

## 2019-02-25 ENCOUNTER — Ambulatory Visit: Payer: BC Managed Care – PPO | Attending: Internal Medicine

## 2019-02-25 ENCOUNTER — Ambulatory Visit: Payer: BLUE CROSS/BLUE SHIELD

## 2019-02-25 DIAGNOSIS — Z23 Encounter for immunization: Secondary | ICD-10-CM | POA: Insufficient documentation

## 2019-02-25 NOTE — Progress Notes (Signed)
   Covid-19 Vaccination Clinic  Name:  JAVANI SPRATT    MRN: [id number] DOB: 05/01/1945  02/25/2019  Mr. Molina was observed post Covid-19 immunization for 15 minutes without incidence. He was provided with Vaccine Information Sheet and instruction to access the V-Safe system.   Mr. Gunnels was instructed to call 911 with any severe reactions post vaccine: Marland Kitchen Difficulty breathing  . Swelling of your face and throat  . A fast heartbeat  . A bad rash all over your body  . Dizziness and weakness    Immunizations Administered    Name Date Dose VIS Date Route   Pfizer COVID-19 Vaccine 02/25/2019 10:46 AM 0.3 mL 12/28/2018 Intramuscular   Manufacturer: [employer], [employer]   Lot: [id number]   NDC: [address]-2

## 2019-06-24 ENCOUNTER — Ambulatory Visit (INDEPENDENT_AMBULATORY_CARE_PROVIDER_SITE_OTHER): Payer: BC Managed Care – PPO

## 2019-06-24 ENCOUNTER — Other Ambulatory Visit: Payer: Self-pay

## 2019-06-24 ENCOUNTER — Ambulatory Visit (INDEPENDENT_AMBULATORY_CARE_PROVIDER_SITE_OTHER): Payer: BC Managed Care – PPO | Admitting: Vascular Surgery

## 2019-06-24 ENCOUNTER — Encounter (INDEPENDENT_AMBULATORY_CARE_PROVIDER_SITE_OTHER): Payer: Self-pay | Admitting: Vascular Surgery

## 2019-06-24 VITALS — BP 132/71 | HR 54 | Resp 16 | Wt 206.8 lb

## 2019-06-24 DIAGNOSIS — E785 Hyperlipidemia, unspecified: Secondary | ICD-10-CM

## 2019-06-24 DIAGNOSIS — I714 Abdominal aortic aneurysm, without rupture, unspecified: Secondary | ICD-10-CM

## 2019-06-24 DIAGNOSIS — I70213 Atherosclerosis of native arteries of extremities with intermittent claudication, bilateral legs: Secondary | ICD-10-CM

## 2019-06-24 DIAGNOSIS — E1169 Type 2 diabetes mellitus with other specified complication: Secondary | ICD-10-CM

## 2019-06-24 DIAGNOSIS — I872 Venous insufficiency (chronic) (peripheral): Secondary | ICD-10-CM | POA: Diagnosis not present

## 2019-06-24 DIAGNOSIS — I25119 Atherosclerotic heart disease of native coronary artery with unspecified angina pectoris: Secondary | ICD-10-CM | POA: Diagnosis not present

## 2019-06-24 DIAGNOSIS — I10 Essential (primary) hypertension: Secondary | ICD-10-CM

## 2019-06-24 NOTE — Progress Notes (Signed)
MRN : [id number]  Jeffery Giles is a 74 y.o. (06/28/1945) male who presents with chief complaint of No chief complaint on file. Marland Kitchen  History of Present Illness:   Jeffery Giles is a 74 y.o. (August 22, 1945) male who presents with chief complaint of follow up EVAR.  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 US guidance for vascular access, bilateral femoral arteries 2. Catheter placement into aorta from bilateral femoral approaches 3. Placement of aC3 23 x 14 x 12Gore Excluder Endoprosthesis main body with a 23 x 14contralateral limb 4. ProGlide closure devices bilateral femoral arteries  Patient denies abdominal pain or back pain, no other abdominal complaints. No groin related complaints. No symptoms consistent with distal embolization No changes in claudication distance.   There have been no interval changes in his overall healthcare since his last visit.  He does ask about his discoloration of his ankle.  This is painless.  Has been progressive with time.  No itching or burning associated with it.  No ulcerations associated.  He has not been wearing compression.  Patient denies amaurosis fugax or TIA symptoms. There is no history of claudication or rest pain symptoms of the lower extremities. The patient denies angina or shortness of breath.  Todaysduplex ultrasound of the aorta iliac system shows a widely patent stent graft with triphasic signals throughout. The maximal diameter of the aortic sac is 4.8 cm the right common iliac is 1.9 cm (previous noted iliac enlargement is not identified). There is no evidence  of endoleak.  No outpatient medications have been marked as taking for the 06/24/19 encounter (Appointment) with Delana Meyer, Dolores Lory, MD.    Past Medical History:  Diagnosis Date   Hypertension     Past Surgical History:  Procedure Laterality Date   ABDOMINAL AORTIC ANEURYSM REPAIR     ENDOVASCULAR REPAIR/STENT GRAFT N/A 10/04/2017   Procedure: ENDOVASCULAR REPAIR/STENT GRAFT;  Surgeon: Katha Cabal, MD;  Location: [facility];  Service: Cardiovascular;  Laterality: N/A;   HERNIA REPAIR      Social History Social History   Tobacco Use   Smoking status: Never Smoker   Smokeless tobacco: Never Used  Substance Use Topics   Alcohol use: Not Currently    Comment: ocassionally   Drug use: No    Family History Family History  Problem Relation Age of Onset   Stroke Father     No Known Allergies   REVIEW OF SYSTEMS (Negative unless checked)  Constitutional: [] Weight loss  [] Fever  [] Chills Cardiac: [] Chest pain   [] Chest pressure   [] Palpitations   [] Shortness of breath when laying flat   [] Shortness of breath with exertion. Vascular:  [x] Pain in legs with walking   [] Pain in legs at rest  [] History of DVT   [] Phlebitis   [] Swelling in legs   [] Varicose veins   [] Non-healing ulcers Pulmonary:   [] Uses home oxygen   [] Productive cough   [] Hemoptysis   [] Wheeze  [] COPD   [] Asthma Neurologic:  [] Dizziness   [] Seizures   [] History of stroke   [] History of TIA  [] Aphasia   [] Vissual changes   [] Weakness or  numbness in arm   [] Weakness or numbness in leg Musculoskeletal:   [] Joint swelling   [x] Joint pain   [] Low back pain Hematologic:  [] Easy bruising  [] Easy bleeding   [] Hypercoagulable state   [] Anemic Gastrointestinal:  [] Diarrhea   [] Vomiting  [] Gastroesophageal reflux/heartburn   [] Difficulty swallowing. Genitourinary:  [] Chronic kidney disease   [] Difficult urination  [] Frequent urination   [] Blood in urine Skin:  [] Rashes   [] Ulcers  Psychological:   [] History of anxiety   []  History of major depression.  Physical Examination  There were no vitals filed for this visit. There is no height or weight on file to calculate BMI. Gen: WD/WN, NAD Head: [place]/AT, No temporalis wasting.  Ear/Nose/Throat: Hearing grossly intact, nares w/o erythema or drainage Eyes: PER, EOMI, sclera nonicteric.  Neck: Supple, no large masses.   Pulmonary:  Good air movement, no audible wheezing bilaterally, no use of accessory muscles.  Cardiac: RRR, no JVD Vascular: scattered varicosities present bilaterally.  Mild venous stasis changes to the legs bilaterally.  2+ soft pitting edema Vessel Right Left  Radial Palpable Palpable  PT Palpable Palpable  DP Palpable Palpable  Gastrointestinal: Non-distended. No guarding/no peritoneal signs.  Musculoskeletal: M/S 5/5 throughout.  No deformity or atrophy.  Neurologic: CN 2-12 intact. Symmetrical.  Speech is fluent. Motor exam as listed above. Psychiatric: Judgment intact, Mood & affect appropriate for pt's clinical situation. Dermatologic: No rashes or ulcers noted.  No changes consistent with cellulitis.   CBC Lab Results  Component Value Date   WBC 11.2 (H) 10/05/2017   HGB 14.8 10/05/2017   HCT 43.8 10/05/2017   MCV 95.2 10/05/2017   PLT 184 10/05/2017    BMET    Component Value Date/Time   NA 140 10/05/2017 0501   NA 139 07/04/2013 0345   K 4.2 10/05/2017 1242   K 4.3 07/04/2013 0345   CL 103 10/05/2017 0501   CL 105 07/04/2013 0345   CO2 31 10/05/2017 0501   CO2 28 07/04/2013 0345   GLUCOSE 168 (H) 10/05/2017 0501   GLUCOSE 131 (H) 07/04/2013 0345   BUN 24 (H) 10/05/2017 0501   BUN 17 07/04/2013 0345   CREATININE 1.05 10/05/2017 0501   CREATININE 1.15 07/04/2013 0345   CALCIUM 9.4 10/05/2017 0501   CALCIUM 8.5 07/04/2013 0345   GFRNONAA >60 10/05/2017 0501   GFRNONAA >60 07/04/2013 0345   GFRAA >60 10/05/2017 0501   GFRAA >60 07/04/2013 0345   CrCl cannot be calculated (Patient's most  recent lab result is older than the maximum 21 days allowed.).  COAG Lab Results  Component Value Date   INR 0.96 10/04/2017   INR 1.1 07/04/2013    Radiology No results found.   Assessment/Plan 1. AAA (abdominal aortic aneurysm) without rupture (HCC) Recommend:   Patient is status post successful endovascular repair of the AAA.   No further intervention is required at this time.   No endoleak is detected and the iliac artery sac and the aortic aneurysm sac is stable.  The patient will continue antiplatelet therapy as prescribed as well as aggressive management of hyperlipidemia. Exercise is again strongly encouraged.   However, endografts require continued surveillance with ultrasound or CT scan. This is mandatory to detect any changes that allow repressurization of the aneurysm sac.  The patient is informed that this would be asymptomatic.  The patient is reminded that lifelong routine surveillance is a necessity with an endograft. Patient will follow-up at 12 month intervals with ultrasound of the aorta.  -  VAS US AORTA/IVC/ILIACS; Future  2. Atherosclerosis of native artery of both lower extremities with intermittent claudication (HCC) Recommend:  The patient has evidence of atherosclerosis of the lower extremities with claudication.  The patient does not voice lifestyle limiting changes at this point in time.  Patient notes he has been walking regularly without much difficulty until the weather became cold  No invasive studies, angiography or surgery at this time The patient should continue walking and begin a more formal exercise program.  The patient should continue antiplatelet therapy and aggressive treatment of the lipid abnormalities  No changes in the patient's medications at this time  The patient should continue wearing graduated compression socks 10-15 mmHg strength to control the mild edema.   - VAS [place] ABI WITH/WO TBI; Future  3. Chronic venous  insufficiency No surgery or intervention at this point in time.  I have reviewed my discussion with the patient regarding venous insufficiency and why it causes symptoms. I have discussed with the patient the chronic skin changes that accompany venous insufficiency and the long term sequela such as ulceration. Patient will contnue wearing graduated compression stockings on a daily basis, as this has provided excellent control of his edema. The patient will put the stockings on first thing in the morning and removing them in the evening. The patient is reminded not to sleep in the stockings.  In addition, behavioral modification including elevation during the day will be initiated. Exercise is strongly encouraged.  4. Coronary artery disease involving native coronary artery of native heart with angina pectoris (HCC) Continue cardiac and antihypertensive medications as already ordered and reviewed, no changes at this time.  Continue statin as ordered and reviewed, no changes at this time  Nitrates PRN for chest pain   5. Essential hypertension Continue antihypertensive medications as already ordered, these medications have been reviewed and there are no changes at this time.   6. Type 2 diabetes mellitus with hyperlipidemia (HCC) Continue hypoglycemic medications as already ordered, these medications have been reviewed and there are no changes at this time.  Hgb A1C to be monitored as already arranged by primary service    Levora Dredge, MD  06/24/2019 10:00 AM

## 2019-10-21 ENCOUNTER — Ambulatory Visit: Payer: BC Managed Care – PPO | Attending: Internal Medicine

## 2019-10-21 DIAGNOSIS — Z23 Encounter for immunization: Secondary | ICD-10-CM

## 2019-10-21 NOTE — Progress Notes (Signed)
   Covid-19 Vaccination Clinic  Name:  Jeffery Giles    MRN: [id number] DOB: [birth year]  10/21/2019  Jeffery Giles was observed post Covid-19 immunization for 15 minutes without incident. He was provided with Vaccine Information Sheet and instruction to access the V-Safe system.   Jeffery Giles was instructed to call 911 with any severe reactions post vaccine: Marland Kitchen Difficulty breathing  . Swelling of face and throat  . A fast heartbeat  . A bad rash all over body  . Dizziness and weakness

## 2020-06-20 NOTE — Progress Notes (Signed)
MRN : [id number]  Jeffery Giles is a 75 y.o. (Feb 18, 1945) male who presents with chief complaint of No chief complaint on file. 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 US guidance for vascular access, bilateral femoral arteries 2. Catheter placement into aorta from bilateral femoral approaches 3. Placement of aC3 23 x 14 x 12Gore Excluder Endoprosthesis main body with a 23 x 14contralateral limb 4. ProGlide closure devices bilateral femoral arteries  Patient denies abdominal pain or back pain, no other abdominal complaints. No groin related complaints. No symptoms consistent with distal embolization No changes in claudication distance.   There have been no interval changes in his overall healthcare since his last visit.He does ask about his discoloration of his ankle. This is painless. Has been progressive with time. No itching or burning associated with it. No ulcerations associated. He has not been wearing compression.  Patient denies amaurosis fugax or TIA symptoms. There is no history of claudication or rest pain symptoms of the lower extremities. The patient denies angina or shortness of breath.  Todaysduplex ultrasound of the aorta iliac system shows a widely patent stent graft with triphasic signals throughout. The maximal diameter of the aortic sac is 5.6 cm the right common iliac is 1.54 and the left common iliac is 1.9 cm. There is no evidence of endoleak.  Previous duplex dated 06/24/2019 diameter of the aortic sac is 4.8 cm.  No outpatient medications have been  marked as taking for the 06/22/20 encounter (Appointment) with Gilda Crease, Latina Craver, MD.    Past Medical History:  Diagnosis Date  . Hypertension     Past Surgical History:  Procedure Laterality Date  . ABDOMINAL AORTIC ANEURYSM REPAIR    . ENDOVASCULAR REPAIR/STENT GRAFT N/A 10/04/2017   Procedure: ENDOVASCULAR REPAIR/STENT GRAFT;  Surgeon: Renford Dills, MD;  Location: [facility] INVASIVE CV [facility];  Service: Cardiovascular;  Laterality: N/A;  . HERNIA REPAIR      Social History Social History   Tobacco Use  . Smoking status: Never Smoker  . Smokeless tobacco: Never Used  Substance Use Topics  . Alcohol use: Not Currently    Comment: ocassionally  . Drug use: No    Family History Family History  Problem Relation Age of Onset  . Stroke Father     No Known Allergies   REVIEW OF SYSTEMS (Negative unless checked)  Constitutional: [] Weight loss  [] Fever  [] Chills Cardiac: [] Chest pain   [] Chest pressure   [] Palpitations   [] Shortness of breath when laying flat   [] Shortness of breath with exertion. Vascular:  [x] Pain in legs with walking   [] Pain in legs at rest  [] History of DVT   [] Phlebitis   [x] Swelling in legs   [x] Varicose veins   [] Non-healing ulcers Pulmonary:   [] Uses home oxygen   [] Productive cough   [] Hemoptysis   [] Wheeze  [] COPD   [] Asthma Neurologic:  [] Dizziness   [] Seizures   [] History of stroke   [] History of TIA  [] Aphasia   [] Vissual changes   [] Weakness or numbness in arm   [] Weakness or numbness in leg Musculoskeletal:   [] Joint  swelling   [x] Joint pain   [] Low back pain Hematologic:  [] Easy bruising  [] Easy bleeding   [] Hypercoagulable state   [] Anemic Gastrointestinal:  [] Diarrhea   [] Vomiting  [] Gastroesophageal reflux/heartburn   [] Difficulty swallowing. Genitourinary:  [] Chronic kidney disease   [] Difficult urination  [] Frequent urination   [] Blood in urine Skin:  [] Rashes   [] Ulcers  Psychological:  [] History of anxiety   []  History of major  depression.  Physical Examination  There were no vitals filed for this visit. There is no height or weight on file to calculate BMI. Gen: WD/WN, NAD Head: [place]/AT, No temporalis wasting.  Ear/Nose/Throat: Hearing grossly intact, nares w/o erythema or drainage Eyes: PER, EOMI, sclera nonicteric.  Neck: Supple, no large masses.   Pulmonary:  Good air movement, no audible wheezing bilaterally, no use of accessory muscles.  Cardiac: RRR, no JVD Vascular: scattered varicosities present bilaterally.  Mild venous stasis changes to the legs bilaterally.  2+ soft pitting edema. Vessel Right Left  Radial Palpable Palpable  PT Trace  Palpable Trace Palpable  DP Trace  Palpable Trace Palpable  Gastrointestinal: Non-distended. No guarding/no peritoneal signs.  Musculoskeletal: M/S 5/5 throughout.  No deformity or atrophy.  Neurologic: CN 2-12 intact. Symmetrical.  Speech is fluent. Motor exam as listed above. Psychiatric: Judgment intact, Mood & affect appropriate for pt's clinical situation. Dermatologic: No rashes or ulcers noted.  No changes consistent with cellulitis.   CBC Lab Results  Component Value Date   WBC 11.2 (H) 10/05/2017   HGB 14.8 10/05/2017   HCT 43.8 10/05/2017   MCV 95.2 10/05/2017   PLT 184 10/05/2017    BMET    Component Value Date/Time   NA 140 10/05/2017 0501   NA 139 07/04/2013 0345   K 4.2 10/05/2017 1242   K 4.3 07/04/2013 0345   CL 103 10/05/2017 0501   CL 105 07/04/2013 0345   CO2 31 10/05/2017 0501   CO2 28 07/04/2013 0345   GLUCOSE 168 (H) 10/05/2017 0501   GLUCOSE 131 (H) 07/04/2013 0345   BUN 24 (H) 10/05/2017 0501   BUN 17 07/04/2013 0345   CREATININE 1.05 10/05/2017 0501   CREATININE 1.15 07/04/2013 0345   CALCIUM 9.4 10/05/2017 0501   CALCIUM 8.5 07/04/2013 0345   GFRNONAA >60 10/05/2017 0501   GFRNONAA >60 07/04/2013 0345   GFRAA >60 10/05/2017 0501   GFRAA >60 07/04/2013 0345   CrCl cannot be calculated (Patient's most recent lab  result is older than the maximum 21 days allowed.).  COAG Lab Results  Component Value Date   INR 0.96 10/04/2017   INR 1.1 07/04/2013    Radiology No results found.   Assessment/Plan 1. AAA (abdominal aortic aneurysm) without rupture (HCC) Recommend: Patient is status post successful endovascular repair of the AAA.   No further intervention is required at this time.   No endoleak is detected but the aneurysm sac slightly increased compared to a year ago.  The patient will continue antiplatelet therapy as prescribed as well as aggressive management of hyperlipidemia. Exercise is again strongly encouraged.   However, endografts require continued surveillance with ultrasound or CT scan. This is mandatory to detect any changes that allow repressurization of the aneurysm sac.  The patient is informed that this would be asymptomatic.  The patient is reminded that lifelong routine surveillance is a necessity with an endograft. Patient will continue to follow-up at 6 month intervals with ultrasound of the aorta given the increase in sac size.  IF the aneurysm  sac is increased yet again in 6 months then a CT scan will be obtained.  - VAS US AORTA/IVC/ILIACS; Future  2. Atherosclerosis of native artery of both lower extremities with intermittent claudication (HCC) Recommend:  I do not find evidence of life style limiting vascular disease. The patient specifically denies life style limitation.  Previous noninvasive studies including ABI's of the legs do not identify critical vascular problems.  The patient should continue walking and begin a more formal exercise program. The patient should continue his antiplatelet therapy and aggressive treatment of the lipid abnormalities.  The patient should begin wearing graduated compression socks 15-20 mmHg strength to control her mild edema.   3. Chronic venous insufficiency No surgery or intervention at this point in time.    I have had a  long discussion with the patient regarding venous insufficiency and why it  causes symptoms. I have discussed with the patient the chronic skin changes that accompany venous insufficiency and the long term sequela such as infection and ulceration.  Patient will begin wearing graduated compression stockings. The patient will put the stockings on first thing in the morning and removing them in the evening. The patient is instructed specifically not to sleep in the stockings.    In addition, behavioral modification including several periods of elevation of the lower extremities during the day will be continued. I have demonstrated that proper elevation is a position with the ankles at heart level.  The patient is instructed to begin routine exercise, especially walking on a daily basis  4. Coronary artery disease involving native coronary artery of native heart with angina pectoris (HCC) Continue cardiac and antihypertensive medications as already ordered and reviewed, no changes at this time.  Continue statin as ordered and reviewed, no changes at this time  Nitrates PRN for chest pain   5. Essential hypertension Continue antihypertensive medications as already ordered, these medications have been reviewed and there are no changes at this time.    Levora Dredge, MD  06/20/2020 12:30 PM

## 2020-06-22 ENCOUNTER — Other Ambulatory Visit: Payer: Self-pay

## 2020-06-22 ENCOUNTER — Ambulatory Visit (INDEPENDENT_AMBULATORY_CARE_PROVIDER_SITE_OTHER): Payer: BC Managed Care – PPO | Admitting: Vascular Surgery

## 2020-06-22 ENCOUNTER — Encounter (INDEPENDENT_AMBULATORY_CARE_PROVIDER_SITE_OTHER): Payer: Self-pay | Admitting: Vascular Surgery

## 2020-06-22 ENCOUNTER — Ambulatory Visit (INDEPENDENT_AMBULATORY_CARE_PROVIDER_SITE_OTHER): Payer: BC Managed Care – PPO

## 2020-06-22 VITALS — BP 160/80 | HR 52 | Resp 16 | Wt 200.8 lb

## 2020-06-22 DIAGNOSIS — I25119 Atherosclerotic heart disease of native coronary artery with unspecified angina pectoris: Secondary | ICD-10-CM

## 2020-06-22 DIAGNOSIS — I70213 Atherosclerosis of native arteries of extremities with intermittent claudication, bilateral legs: Secondary | ICD-10-CM

## 2020-06-22 DIAGNOSIS — I714 Abdominal aortic aneurysm, without rupture, unspecified: Secondary | ICD-10-CM

## 2020-06-22 DIAGNOSIS — I10 Essential (primary) hypertension: Secondary | ICD-10-CM

## 2020-06-22 DIAGNOSIS — I872 Venous insufficiency (chronic) (peripheral): Secondary | ICD-10-CM

## 2020-06-23 ENCOUNTER — Encounter (INDEPENDENT_AMBULATORY_CARE_PROVIDER_SITE_OTHER): Payer: Self-pay | Admitting: Vascular Surgery

## 2020-10-02 IMAGING — CT CT MAXILLOFACIAL W/O CM
4 of 10 series · 15 of 47 positions shown, 17 images · non-contrast
Comparison: None.

CLINICAL DATA: Fall with laceration to left eyebrow

EXAM:
CT HEAD WITHOUT CONTRAST
CT MAXILLOFACIAL WITHOUT CONTRAST
CT CERVICAL SPINE WITHOUT CONTRAST
TECHNIQUE: Multidetector CT imaging of the head, cervical spine, and
maxillofacial structures were performed using the standard protocol
without intravenous contrast. Multiplanar CT image reconstructions
of the cervical spine and maxillofacial structures were also
generated.

[Series 7: c spine soft · axial · 0.31mm/px · z∈[-327,-223]mm · 5 of 105 slices shown]
[im 14/105  brain]
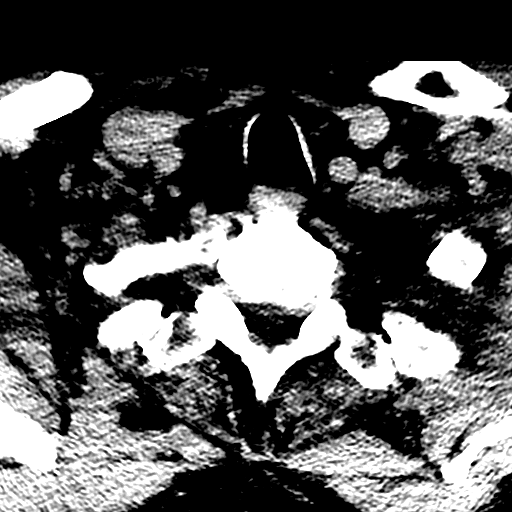
[im 27/105  brain]
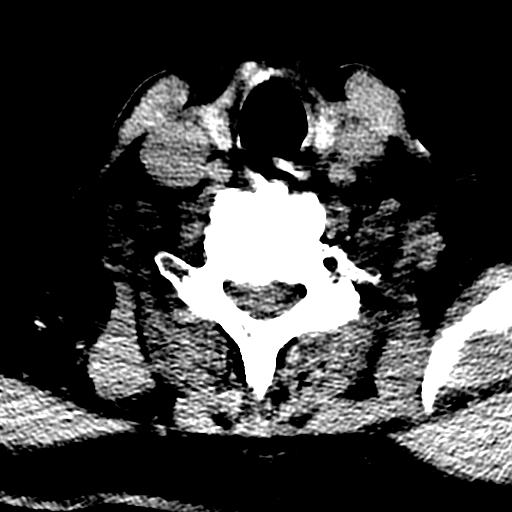
[im 40/105  brain]
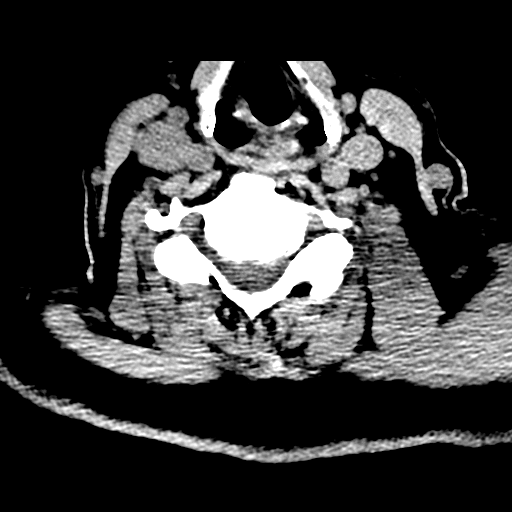
[im 53/105  brain]
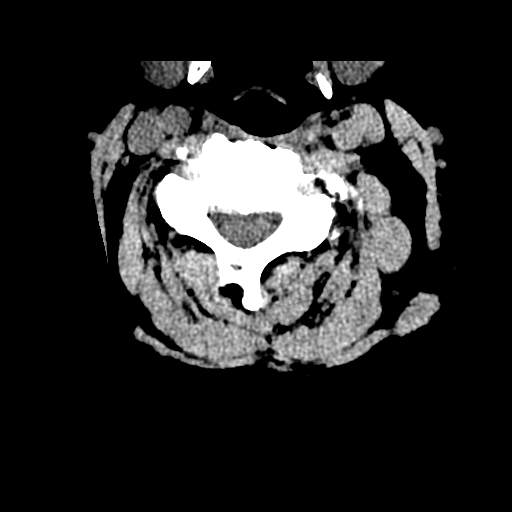
[im 66/105  brain]
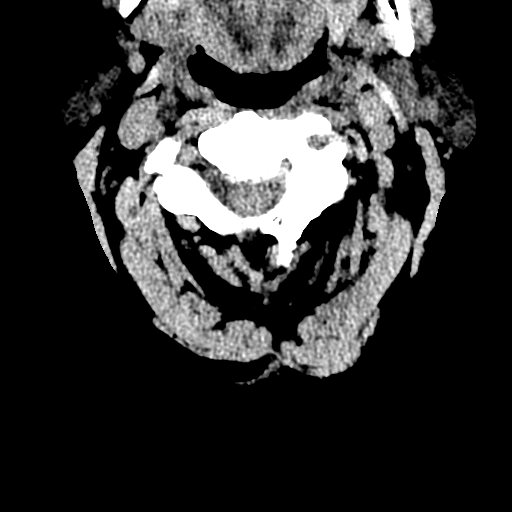

[Series 12: coronal soft tissue · coronal · 0.37mm/px · 2 of 78 slices shown]
[im 26/78  bone]
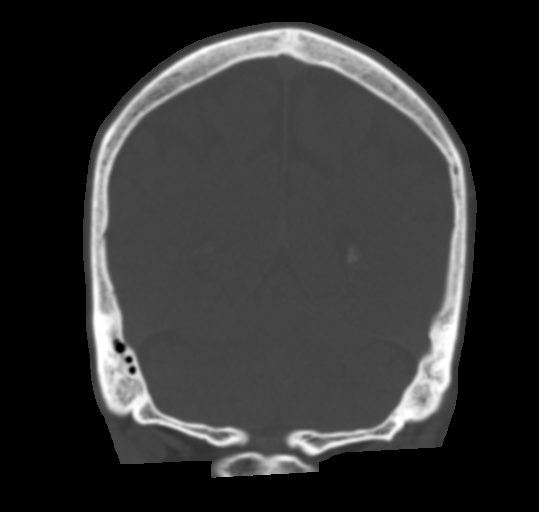
[im 52/78  bone]
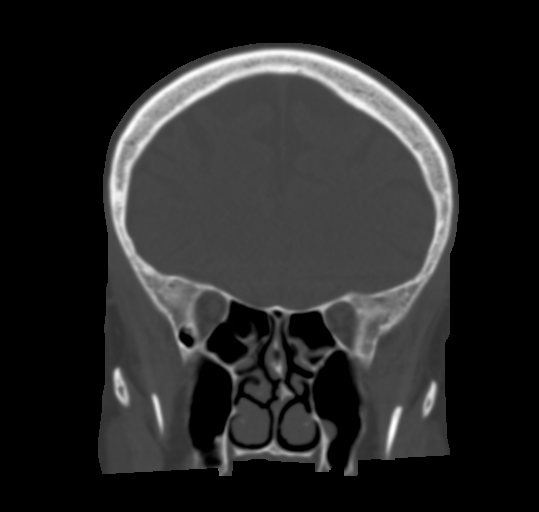

[Series 16: orthogonal axials · axial · 0.23mm/px · z∈[-368,-212]mm · 7 of 111 slices shown, 9 images]
[im 14/111  brain]
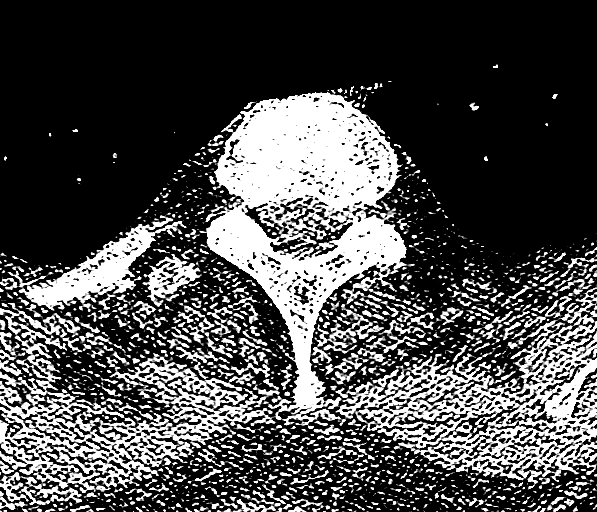
[im 14/111  bone]
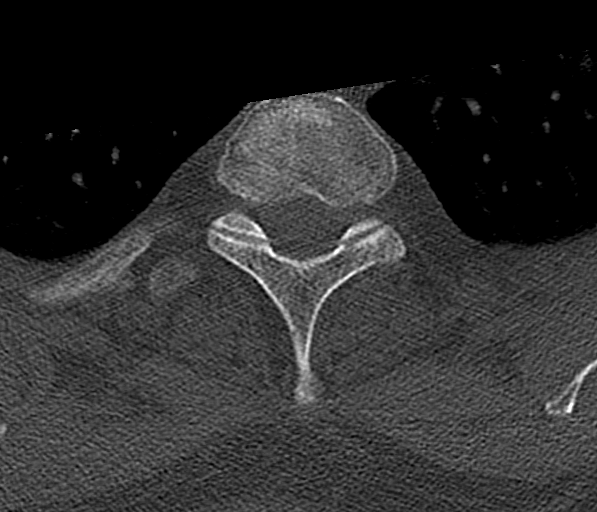
[im 28/111  bone]
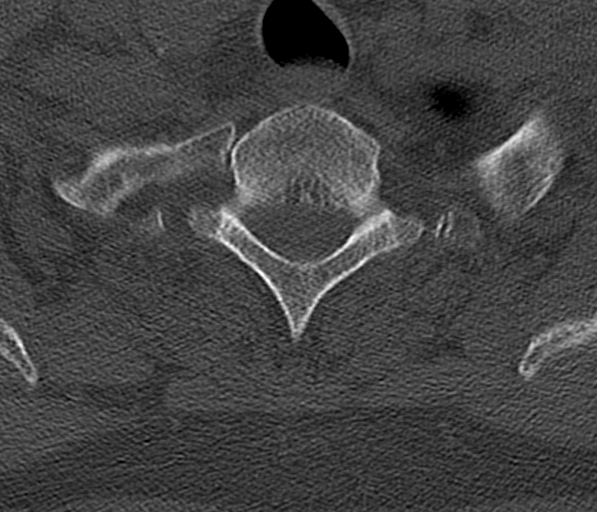
[im 42/111  bone]
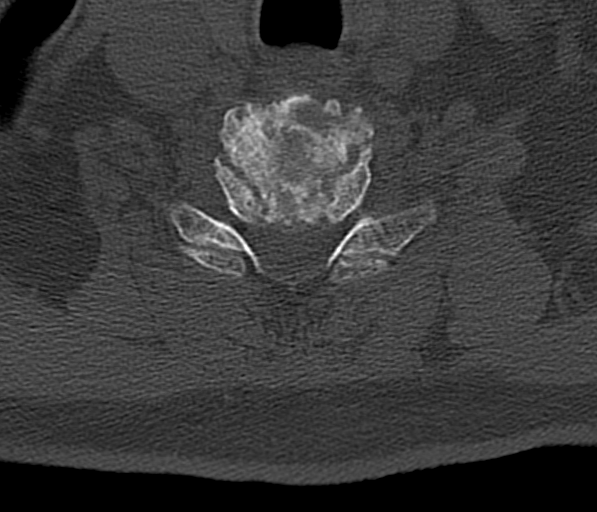
[im 56/111  bone]
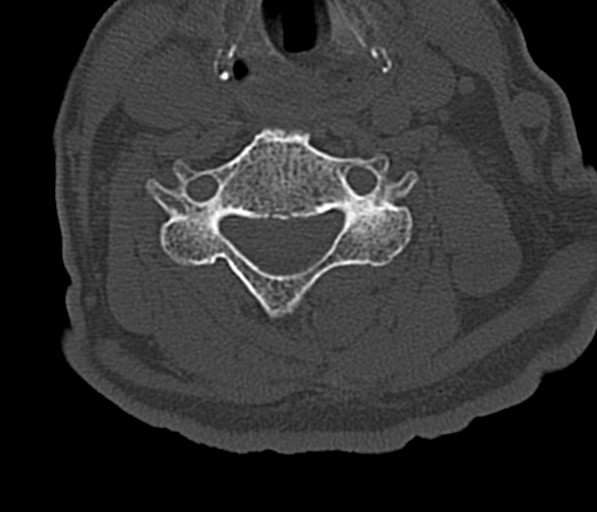
[im 69/111  brain]
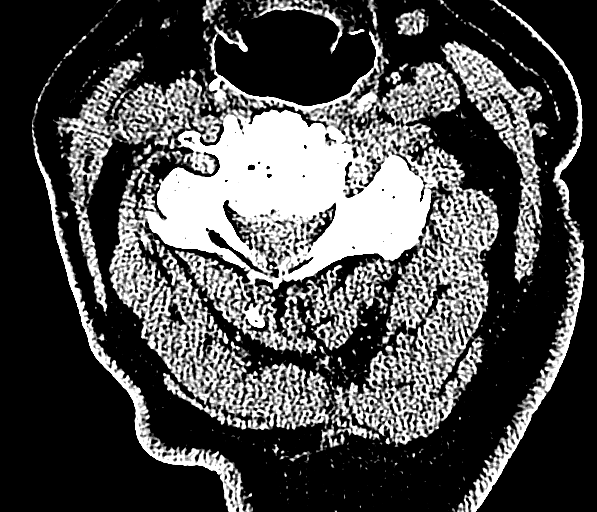
[im 69/111  bone]
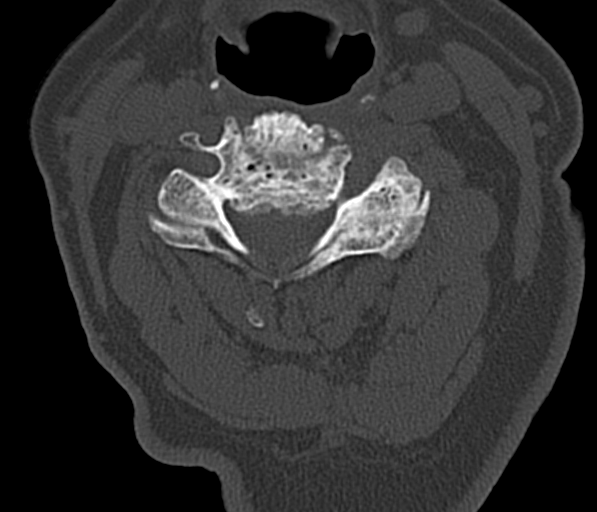
[im 83/111  bone]
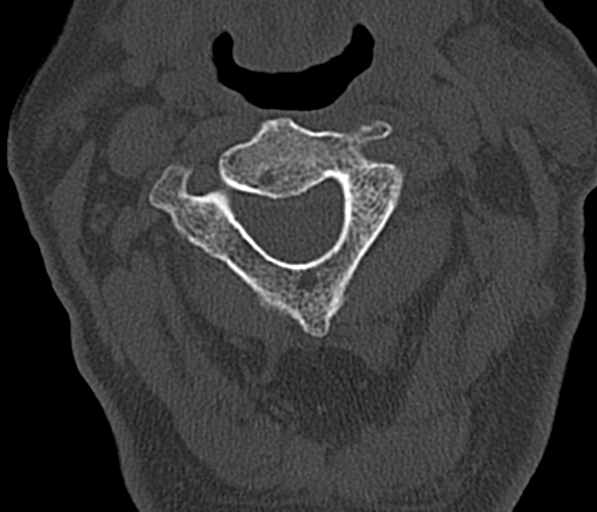
[im 97/111  bone]
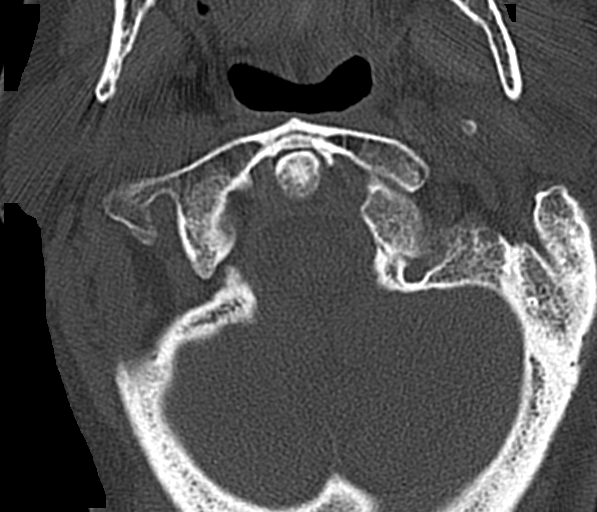

[Series 18: sagittal soft · sagittal · 0.45mm/px · 1 of 85 slices shown]
[im 43/85  bone]
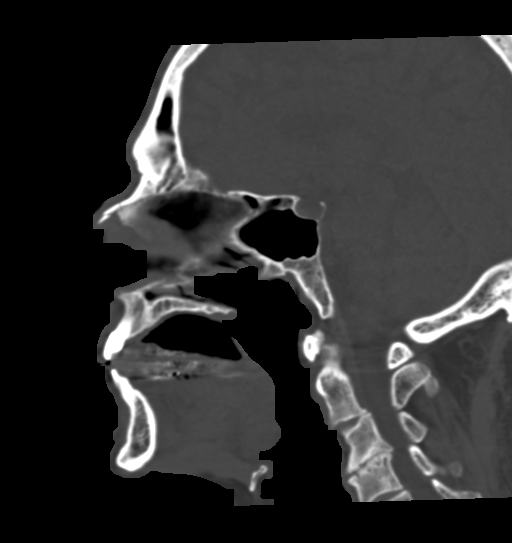

[15 of 47 positions shown; findings below may reference images not displayed]

FINDINGS: CT HEAD FINDINGS

Brain: No acute territorial infarction, hemorrhage, or intracranial
mass. Moderate atrophy. Mild hypodensity in the white matter
consistent with chronic small vessel ischemic change. Nonenlarged
ventricles

Vascular: No hyperdense vessels.  Carotid vascular calcification

Skull: Normal. Negative for fracture or focal lesion.

Other: Forehead soft tissue hematoma

CT MAXILLOFACIAL FINDINGS

Osseous: Fluid in the left mastoids. Mandibular heads are normally
position. No mandibular fracture. Acute mildly depressed and
comminuted bilateral nasal bone fracture. Pterygoid plates and
zygomatic arches are intact.

Orbits: Negative. No traumatic or inflammatory finding.

Sinuses: Mucosal thickening in the ethmoid sinuses. Mucous retention
cysts in the maxillary sinuses. No sinus wall fracture

Soft tissues: Scalp soft tissue swelling over the forehead and nasal
region.

CT CERVICAL SPINE FINDINGS

Alignment: Straightening of the cervical spine. 2 mm anterolisthesis
C3 on C4. Facet alignment is maintained.

Skull base and vertebrae: No acute fracture. No primary bone lesion
or focal pathologic process.

Soft tissues and spinal canal: No prevertebral fluid or swelling. No
visible canal hematoma.

Disc levels: Advanced degenerative changes at C3-C4, C5-C6, C6-C7
and C7-T1. Mild degenerative change at C4-C5. Facet degenerative
change at multiple levels with multiple level foraminal stenosis.

Upper chest: Lung apices are clear.

Other: None
IMPRESSION: 1. No CT evidence for acute intracranial abnormality. Atrophy and
mild small vessel ischemic changes of the white matter.
2. Straightening of the cervical spine with 2 mm anterolisthesis C3
on C4, probably degenerative. No acute fracture is seen. Multiple
level degenerative changes
3. Acute mildly comminuted and depressed bilateral nasal bone
fracture with overlying soft tissue swelling and forehead soft
tissue hematoma. No other acute facial bone fracture is visualized

## 2020-12-25 ENCOUNTER — Other Ambulatory Visit (INDEPENDENT_AMBULATORY_CARE_PROVIDER_SITE_OTHER): Payer: Self-pay | Admitting: Vascular Surgery

## 2020-12-25 DIAGNOSIS — I70213 Atherosclerosis of native arteries of extremities with intermittent claudication, bilateral legs: Secondary | ICD-10-CM

## 2020-12-25 DIAGNOSIS — I714 Abdominal aortic aneurysm, without rupture, unspecified: Secondary | ICD-10-CM

## 2020-12-27 NOTE — Progress Notes (Signed)
MRN : [id number]  Jeffery Giles is a 75 y.o. (30-Jan-1945) male who presents with chief complaint of check my aneurysm.  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 No groin related complaints. No symptoms consistent with distal embolization No changes in claudication distance.    There have been no interval changes in his overall healthcare since his last visit except his BP has been up and recently his antihypertensive medication was changed.    He states the discoloration of his ankle is about the same.  This is painless.  Has been progressive with time.  No itching or burning associated with it.  No ulcerations associated.  He has not been wearing compression.   Patient denies amaurosis fugax or TIA symptoms. There is no history of claudication or rest pain symptoms of the lower extremities. The patient denies angina or shortness of breath.    Todays duplex ultrasound of the aorta iliac system shows a widely patent stent graft with triphasic signals throughout.  The maximal diameter of the aortic sac is 5.1 cm.  There is no evidence of endoleak.  Previous duplex dated 06/24/2019 diameter of the aortic sac is 5.6 cm.  ABI's=1.09 and Lt=1.06  (previous ABI's=1.08 and Lt=1.17)  No outpatient medications have been marked as taking for the 12/28/20 encounter (Appointment) with Gilda Crease, Latina Craver, MD.    Past Medical History:  Diagnosis Date   Hypertension     Past Surgical History:  Procedure Laterality Date   ABDOMINAL AORTIC ANEURYSM REPAIR     ENDOVASCULAR REPAIR/STENT GRAFT N/A 10/04/2017   Procedure: ENDOVASCULAR REPAIR/STENT GRAFT;  Surgeon: Renford Dills, MD;  Location: [facility] INVASIVE CV LAB;  Service: Cardiovascular;  Laterality: N/A;   HERNIA REPAIR      Social History Social History   Tobacco Use   Smoking status: Never   Smokeless tobacco: Never  Substance Use Topics   Alcohol use: Not Currently    Comment: ocassionally   Drug use: No    Family History Family History  Problem Relation Age of Onset   Stroke Father     No Known Allergies   REVIEW OF SYSTEMS (Negative unless checked)  Constitutional: [] Weight loss  [] Fever  [] Chills Cardiac: [] Chest pain   [] Chest pressure   [] Palpitations   [] Shortness of breath when laying flat   [] Shortness of breath with exertion. Vascular:  [] Pain in legs with walking   [] Pain in legs at rest  [] History of DVT   [] Phlebitis   [] Swelling in legs   [x] Varicose veins   [] Non-healing ulcers Pulmonary:   [] Uses home oxygen   [] Productive cough   [] Hemoptysis   [] Wheeze  [] COPD   [] Asthma Neurologic:  [] Dizziness   [] Seizures   [] History of stroke   [] History of TIA  []   Aphasia   [] Vissual changes   [] Weakness or numbness in arm   [] Weakness or numbness in leg Musculoskeletal:   [] Joint swelling   [] Joint pain   [] Low back pain Hematologic:  [] Easy bruising  [] Easy bleeding   [] Hypercoagulable state   [] Anemic Gastrointestinal:  [] Diarrhea   [] Vomiting  [] Gastroesophageal reflux/heartburn   [] Difficulty swallowing. Genitourinary:  [] Chronic kidney disease   [] Difficult urination  [] Frequent urination   [] Blood in urine Skin:  [] Rashes   [] Ulcers  Psychological:   [] History of anxiety   []  History of major depression.  Physical Examination  There were no vitals filed for this visit. There is no height or weight on file to calculate BMI. Gen: WD/WN, NAD Head: [place]/AT, No temporalis wasting.  Ear/Nose/Throat: Hearing grossly intact, nares w/o erythema or drainage Eyes: PER, EOMI, sclera nonicteric.  Neck: Supple, no masses.  No bruit or JVD.  Pulmonary:  Good air movement, no audible wheezing, no use of accessory muscles.  Cardiac: RRR, normal S1, S2, no Murmurs. Vascular:   Vessel Right Left  Radial Palpable Palpable  PT Not Palpable Not Palpable  DP Not Palpable Not Palpable  Gastrointestinal: soft, non-distended. No guarding/no peritoneal signs.  Musculoskeletal: M/S 5/5 throughout.  No visible deformity.  Neurologic: CN 2-12 intact. Pain and light touch intact in extremities.  Symmetrical.  Speech is fluent. Motor exam as listed above. Psychiatric: Judgment intact, Mood & affect appropriate for pt's clinical situation. Dermatologic: No rashes or ulcers noted.  No changes consistent with cellulitis.   CBC Lab Results  Component Value Date   WBC 11.2 (H) 10/05/2017   HGB 14.8 10/05/2017   HCT 43.8 10/05/2017   MCV 95.2 10/05/2017   PLT 184 10/05/2017    BMET    Component Value Date/Time   NA 140 10/05/2017 0501   NA 139 07/04/2013 0345   K 4.2 10/05/2017 1242   K 4.3 07/04/2013 0345   CL 103 10/05/2017 0501   CL 105 07/04/2013 0345   CO2 31 10/05/2017 0501   CO2 28 07/04/2013 0345   GLUCOSE 168 (H) 10/05/2017 0501   GLUCOSE 131 (H) 07/04/2013 0345   BUN 24 (H) 10/05/2017 0501   BUN 17 07/04/2013 0345   CREATININE 1.05 10/05/2017 0501   CREATININE 1.15 07/04/2013 0345   CALCIUM 9.4 10/05/2017 0501   CALCIUM 8.5 07/04/2013 0345   GFRNONAA >60 10/05/2017 0501   GFRNONAA >60 07/04/2013 0345   GFRAA >60 10/05/2017 0501   GFRAA >60 07/04/2013 0345   CrCl cannot be calculated (Patient's most recent lab result is older than the  maximum 21 days allowed.).  COAG Lab Results  Component Value Date   INR 0.96 10/04/2017   INR 1.1 07/04/2013    Radiology No results found.   Assessment/Plan 1. Infrarenal abdominal aortic aneurysm (AAA) without rupture Recommend: Patient is status post successful endovascular repair of the AAA.    No further intervention is required at this time.   No endoleak is detected but the aneurysm sac slightly increased compared to a year ago.   The patient will continue antiplatelet therapy as prescribed as well as aggressive management of hyperlipidemia. Exercise is again strongly encouraged.    However, endografts require continued surveillance with ultrasound or CT scan. This is mandatory to detect any changes that allow repressurization of the aneurysm sac.  The patient is informed that this would be asymptomatic.   The patient is reminded that lifelong routine surveillance is a necessity with an endograft. Patient will  continue to follow-up at 12 month intervals with ultrasound of the aorta given the stable sac size.    - VAS US AORTA/IVC/ILIACS; Future  2. Atherosclerosis of native artery of both lower extremities with intermittent claudication (HCC) Recommend:   I do not find evidence of life style limiting vascular disease. The patient specifically denies life style limitation.   Previous noninvasive studies including ABI's of the legs do not identify critical vascular problems.   The patient should continue walking and begin a more formal exercise program. The patient should continue his antiplatelet therapy and aggressive treatment of the lipid abnormalities.   The patient is encouraged to wear graduated compression socks 15-20 mmHg strength to control her mild edema.  - VAS [place] ABI WITH/WO TBI; Future  3. Chronic venous insufficiency No surgery or intervention at this point in time.     I have had a long discussion with the patient regarding venous insufficiency and  why it  causes symptoms. I have discussed with the patient the chronic skin changes that accompany venous insufficiency and the long term sequela such as infection and ulceration.  Patient will begin wearing graduated compression stockings. The patient will put the stockings on first thing in the morning and removing them in the evening. The patient is instructed specifically not to sleep in the stockings.     In addition, behavioral modification including several periods of elevation of the lower extremities during the day will be continued. I have demonstrated that proper elevation is a position with the ankles at heart level.   The patient should continue routine exercise, especially walking on a daily basis  4. Essential hypertension Continue antihypertensive medications as already ordered, these medications have been reviewed and there are no changes at this time.   5. Coronary artery disease involving native coronary artery of native heart with angina pectoris (HCC) Continue cardiac and antihypertensive medications as already ordered and reviewed, no changes at this time.  Continue statin as ordered and reviewed, no changes at this time  Nitrates PRN for chest pain   6. Type 2 diabetes mellitus with hyperlipidemia (HCC) Continue hypoglycemic medications as already ordered, these medications have been reviewed and there are no changes at this time.  Hgb A1C to be monitored as already arranged by primary service     Levora Dredge, MD  12/27/2020 4:45 PM

## 2020-12-28 ENCOUNTER — Ambulatory Visit (INDEPENDENT_AMBULATORY_CARE_PROVIDER_SITE_OTHER): Payer: BC Managed Care – PPO

## 2020-12-28 ENCOUNTER — Ambulatory Visit (INDEPENDENT_AMBULATORY_CARE_PROVIDER_SITE_OTHER): Payer: BC Managed Care – PPO | Admitting: Vascular Surgery

## 2020-12-28 ENCOUNTER — Other Ambulatory Visit: Payer: Self-pay

## 2020-12-28 VITALS — BP 172/87 | HR 89 | Ht 72.0 in | Wt 202.0 lb

## 2020-12-28 DIAGNOSIS — I714 Abdominal aortic aneurysm, without rupture, unspecified: Secondary | ICD-10-CM | POA: Diagnosis not present

## 2020-12-28 DIAGNOSIS — I10 Essential (primary) hypertension: Secondary | ICD-10-CM | POA: Diagnosis not present

## 2020-12-28 DIAGNOSIS — I70213 Atherosclerosis of native arteries of extremities with intermittent claudication, bilateral legs: Secondary | ICD-10-CM | POA: Diagnosis not present

## 2020-12-28 DIAGNOSIS — I872 Venous insufficiency (chronic) (peripheral): Secondary | ICD-10-CM

## 2020-12-28 DIAGNOSIS — E1169 Type 2 diabetes mellitus with other specified complication: Secondary | ICD-10-CM

## 2020-12-28 DIAGNOSIS — I7143 Infrarenal abdominal aortic aneurysm, without rupture: Secondary | ICD-10-CM

## 2020-12-28 DIAGNOSIS — E785 Hyperlipidemia, unspecified: Secondary | ICD-10-CM

## 2020-12-28 DIAGNOSIS — I25119 Atherosclerotic heart disease of native coronary artery with unspecified angina pectoris: Secondary | ICD-10-CM

## 2020-12-29 ENCOUNTER — Encounter (INDEPENDENT_AMBULATORY_CARE_PROVIDER_SITE_OTHER): Payer: Self-pay | Admitting: Vascular Surgery

## 2021-11-15 ENCOUNTER — Encounter (INDEPENDENT_AMBULATORY_CARE_PROVIDER_SITE_OTHER): Payer: Self-pay

## 2021-12-28 ENCOUNTER — Other Ambulatory Visit (INDEPENDENT_AMBULATORY_CARE_PROVIDER_SITE_OTHER): Payer: Self-pay | Admitting: Vascular Surgery

## 2021-12-28 DIAGNOSIS — I70213 Atherosclerosis of native arteries of extremities with intermittent claudication, bilateral legs: Secondary | ICD-10-CM

## 2021-12-28 DIAGNOSIS — I714 Abdominal aortic aneurysm, without rupture, unspecified: Secondary | ICD-10-CM

## 2021-12-30 ENCOUNTER — Ambulatory Visit (INDEPENDENT_AMBULATORY_CARE_PROVIDER_SITE_OTHER): Payer: BC Managed Care – PPO

## 2021-12-30 ENCOUNTER — Ambulatory Visit (INDEPENDENT_AMBULATORY_CARE_PROVIDER_SITE_OTHER): Payer: BC Managed Care – PPO | Admitting: Nurse Practitioner

## 2021-12-30 ENCOUNTER — Encounter (INDEPENDENT_AMBULATORY_CARE_PROVIDER_SITE_OTHER): Payer: Self-pay | Admitting: Nurse Practitioner

## 2021-12-30 VITALS — BP 177/84 | HR 52 | Resp 14 | Ht 72.0 in | Wt 202.0 lb

## 2021-12-30 DIAGNOSIS — I10 Essential (primary) hypertension: Secondary | ICD-10-CM | POA: Diagnosis not present

## 2021-12-30 DIAGNOSIS — E785 Hyperlipidemia, unspecified: Secondary | ICD-10-CM

## 2021-12-30 DIAGNOSIS — I714 Abdominal aortic aneurysm, without rupture, unspecified: Secondary | ICD-10-CM

## 2021-12-30 DIAGNOSIS — I70213 Atherosclerosis of native arteries of extremities with intermittent claudication, bilateral legs: Secondary | ICD-10-CM | POA: Diagnosis not present

## 2021-12-30 DIAGNOSIS — E1169 Type 2 diabetes mellitus with other specified complication: Secondary | ICD-10-CM | POA: Diagnosis not present

## 2021-12-30 NOTE — Progress Notes (Signed)
Subjective:    Patient ID: Jeffery Giles, male    DOB: Jun 29, 1945, 76 y.o.   MRN: [id number] Chief Complaint  Patient presents with   Follow-up    ultraound    The patient returns to the office for surveillance of an abdominal aortic aneurysm status post stent graft placement on 0918/2023.   Patient denies abdominal pain or back pain, no other abdominal complaints. No groin related complaints. No symptoms consistent with distal embolization No changes in claudication distance or new rest pain symptoms.   There have been no interval changes in his overall healthcare since his last visit.   Patient denies amaurosis fugax or TIA symptoms.  The patient denies recent episodes of angina or shortness of breath.   Duplex US of the aorta and iliac arteries shows a 4.12 AAA sac with no endoleak, no change in the sac compared to the previous study.    Review of Systems     Objective:   Physical Exam  BP (!) 177/84 (BP Location: Left Arm)   Pulse (!) 52   Resp 14   Ht 6' (1.829 m)   Wt 202 lb (91.6 kg)   BMI 27.40 kg/m   Past Medical History:  Diagnosis Date   Hypertension     Social History   Socioeconomic History   Marital status: Married    Spouse name: Not on file   Number of children: Not on file   Years of education: Not on file   Highest education level: Not on file  Occupational History   Not on file  Tobacco Use   Smoking status: Never   Smokeless tobacco: Never  Substance and Sexual Activity   Alcohol use: Not Currently    Comment: ocassionally   Drug use: No   Sexual activity: Not on file  Other Topics Concern   Not on file  Social History Narrative   Not on file   Social Determinants of Health   Financial Resource Strain: Not on file  Food Insecurity: Not on file  Transportation Needs: Not on file  Physical Activity: Not on file  Stress: Not on file  Social Connections: Not on file  Intimate Partner Violence: Not on file    Past Surgical  History:  Procedure Laterality Date   ABDOMINAL AORTIC ANEURYSM REPAIR     ENDOVASCULAR REPAIR/STENT GRAFT N/A 10/04/2017   Procedure: ENDOVASCULAR REPAIR/STENT GRAFT;  Surgeon: Renford Dills, MD;  Location: [facility] IN[facility]ASIVE [facility];  Service: Cardiovascular;  Laterality: N/A;   HERNIA REPAIR      Family History  Problem Relation Age of Onset   Stroke Father     No Known Allergies     Latest Ref Rng & Units 10/05/2017    5:01 AM 10/04/2017   11:11 AM 07/04/2013    3:45 AM  CBC  WBC 3.8 - 10.6 K/uL 11.2  7.9  8.5   Hemoglobin 13.0 - 18.0 g/dL 40.9  81.1  91.4   Hematocrit 40.0 - 52.0 % 43.8  46.7  33.2   Platelets 150 - 440 K/uL 184  203  207       CMP     Component Value Date/Time   NA 140 10/05/2017 0501   NA 139 07/04/2013 0345   K 4.2 10/05/2017 1242   K 4.3 07/04/2013 0345   CL 103 10/05/2017 0501   CL 105 07/04/2013 0345   CO2 31 10/05/2017 0501   CO2 28 07/04/2013 0345   GLUCOSE 168 (  H) 10/05/2017 0501   GLUCOSE 131 (H) 07/04/2013 0345   BUN 24 (H) 10/05/2017 0501   BUN 17 07/04/2013 0345   CREATININE 1.05 10/05/2017 0501   CREATININE 1.15 07/04/2013 0345   CALCIUM 9.4 10/05/2017 0501   CALCIUM 8.5 07/04/2013 0345   PROT 5.8 (L) 07/04/2013 0345   ALBUMIN 2.7 (L) 07/04/2013 0345   AST 41 (H) 07/04/2013 0345   ALT 15 07/04/2013 0345   ALKPHOS 69 07/04/2013 0345   BILITOT 0.6 07/04/2013 0345   GFRNONAA >60 10/05/2017 0501   GFRNONAA >60 07/04/2013 0345   GFRAA >60 10/05/2017 0501   GFRAA >60 07/04/2013 0345     No results found.     Assessment & Plan:   1. Abdominal aortic aneurysm (AAA) without rupture, unspecified part (HCC) Recommend:  Patient is status post successful endovascular repair of the AAA.   No further intervention is required at this time.   No endoleak is detected and the aneurysm sac is stable.  The patient will continue antiplatelet therapy as prescribed as well as aggressive management of hyperlipidemia. Exercise is again  strongly encouraged.   However, endografts require continued surveillance with ultrasound or CT scan. This is mandatory to detect any changes that allow repressurization of the aneurysm sac.  The patient is informed that this would be asymptomatic.  The patient is reminded that lifelong routine surveillance is a necessity with an endograft. Patient will continue to follow-up at the specified interval with ultrasound of the aorta.  2. Essential hypertension Continue antihypertensive medications as already ordered, these medications have been reviewed and there are no changes at this time.  3. Type 2 diabetes mellitus with hyperlipidemia (HCC) Continue hypoglycemic medications as already ordered, these medications have been reviewed and there are no changes at this time.  Hgb A1C to be monitored as already arranged by primary service   Current Outpatient Medications on File Prior to Visit  Medication Sig Dispense Refill   allopurinol (ZYLOPRIM) 300 MG tablet Take 300 mg by mouth daily.      aspirin EC 81 MG tablet Take 81 mg by mouth daily.      bisoprolol-hydrochlorothiazide (ZIAC) 10-6.25 MG tablet Take 1 tablet by mouth daily.     levothyroxine (SYNTHROID, LEVOTHROID) 112 MCG tablet Take 112 mcg by mouth daily before breakfast.      losartan (COZAAR) 25 MG tablet Take 1 tablet by mouth at bedtime.     lovastatin (MEVACOR) 10 MG tablet Take 1 tablet by mouth at bedtime.     No current facility-administered medications on file prior to visit.    There are no Patient Instructions on file for this visit. No follow-ups on file.   Georgiana Spinner, NP

## 2022-12-22 ENCOUNTER — Other Ambulatory Visit (INDEPENDENT_AMBULATORY_CARE_PROVIDER_SITE_OTHER): Payer: Self-pay | Admitting: Nurse Practitioner

## 2022-12-22 DIAGNOSIS — I714 Abdominal aortic aneurysm, without rupture, unspecified: Secondary | ICD-10-CM

## 2022-12-25 NOTE — Progress Notes (Unsigned)
MRN : [id number]  Jeffery Giles is a 77 y.o. (Aug 18, 1945) male who presents with chief complaint of check circulation.  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 No groin related complaints. No symptoms consistent with distal embolization No changes in claudication distance.    There have been no interval changes in his overall healthcare since his last visit except his BP has been up and recently his antihypertensive medication was changed.     He states the discoloration of his ankle is about the same.  This is painless.  Has been progressive with time.  No itching or burning associated with it.  No ulcerations associated.  He has not been wearing compression.   Patient denies amaurosis fugax or TIA symptoms. There is no history of claudication or rest pain symptoms of the lower extremities. The patient denies angina or shortness of breath.    Todays duplex ultrasound of the aorta iliac system shows a widely patent stent graft with triphasic signals throughout.  The maximal diameter of the aortic sac is 5.1 cm.  There is no evidence of endoleak.  Previous duplex dated 06/24/2019  diameter of the aortic sac is 5.6 cm.   ABI's=1.09 and Lt=1.06 (previous ABI's=1.08 and Lt=1.17)       No outpatient medications have been marked as taking for the 12/26/22 encounter (Appointment) with Gilda Crease, Latina Craver, MD.    Past Medical History:  Diagnosis Date   Hypertension     Past Surgical History:  Procedure Laterality Date   ABDOMINAL AORTIC ANEURYSM REPAIR     ENDOVASCULAR REPAIR/STENT GRAFT N/A 10/04/2017   Procedure: ENDOVASCULAR REPAIR/STENT GRAFT;  Surgeon: Renford Dills, MD;  Location: [facility];  Service: Cardiovascular;  Laterality: N/A;   HERNIA REPAIR      Social History Social History   Tobacco Use   Smoking status: Never   Smokeless tobacco: Never  Substance Use Topics   Alcohol use: Not Currently    Comment: ocassionally   Drug use: No    Family History Family History  Problem Relation Age of Onset   Stroke Father     No Known Allergies   REVIEW OF SYSTEMS (Negative unless checked)  Constitutional: [] Weight loss  [] Fever  [] Chills Cardiac: [] Chest pain   [] Chest pressure   [] Palpitations   [] Shortness of breath when laying flat   [] Shortness of breath with  exertion. Vascular:  [x] Pain in legs with walking   [] Pain in legs at rest  [] History of DVT   [] Phlebitis   [] Swelling in legs   [] Varicose veins   [] Non-healing ulcers Pulmonary:   [] Uses home oxygen   [] Productive cough   [] Hemoptysis   [] Wheeze  [] COPD   [] Asthma Neurologic:  [] Dizziness   [] Seizures   [] History of stroke   [] History of TIA  [] Aphasia   [] Vissual changes   [] Weakness or numbness in arm   [] Weakness or numbness in leg Musculoskeletal:   [] Joint swelling   [] Joint pain   [] Low back pain Hematologic:  [] Easy bruising  [] Easy bleeding   [] Hypercoagulable state   [] Anemic Gastrointestinal:  [] Diarrhea   [] Vomiting  [] Gastroesophageal reflux/heartburn   [] Difficulty swallowing. Genitourinary:  [] Chronic kidney disease   [] Difficult urination  [] Frequent  urination   [] Blood in urine Skin:  [] Rashes   [] Ulcers  Psychological:  [] History of anxiety   []  History of major depression.  Physical Examination  There were no vitals filed for this visit. There is no height or weight on file to calculate BMI. Gen: WD/WN, NAD Head: [place]/AT, No temporalis wasting.  Ear/Nose/Throat: Hearing grossly intact, nares w/o erythema or drainage Eyes: PER, EOMI, sclera nonicteric.  Neck: Supple, no masses.  No bruit or JVD.  Pulmonary:  Good air movement, no audible wheezing, no use of accessory muscles.  Cardiac: RRR, normal S1, S2, no Murmurs. Vascular:  mild trophic changes, no open wounds Vessel Right Left  Radial Palpable Palpable  PT Not Palpable Not Palpable  DP Not Palpable Not Palpable  Gastrointestinal: soft, non-distended. No guarding/no peritoneal signs.  Musculoskeletal: M/S 5/5 throughout.  No visible deformity.  Neurologic: CN 2-12 intact. Pain and light touch intact in extremities.  Symmetrical.  Speech is fluent. Motor exam as listed above. Psychiatric: Judgment intact, Mood & affect appropriate for pt's clinical situation. Dermatologic: No rashes or ulcers noted.  No changes consistent with cellulitis.   CBC Lab Results  Component Value Date   WBC 11.2 (H) 10/05/2017   HGB 14.8 10/05/2017   HCT 43.8 10/05/2017   MCV 95.2 10/05/2017   PLT 184 10/05/2017    BMET    Component Value Date/Time   NA 140 10/05/2017 0501   NA 139 07/04/2013 0345   K 4.2 10/05/2017 1242   K 4.3 07/04/2013 0345   CL 103 10/05/2017 0501   CL 105 07/04/2013 0345   CO2 31 10/05/2017 0501   CO2 28 07/04/2013 0345   GLUCOSE 168 (H) 10/05/2017 0501   GLUCOSE 131 (H) 07/04/2013 0345   BUN 24 (H) 10/05/2017 0501   BUN 17 07/04/2013 0345   CREATININE 1.05 10/05/2017 0501   CREATININE 1.15 07/04/2013 0345   CALCIUM 9.4 10/05/2017 0501   CALCIUM 8.5 07/04/2013 0345   GFRNONAA >60 10/05/2017 0501   GFRNONAA >60 07/04/2013 0345   GFRAA >60 10/05/2017 0501    GFRAA >60 07/04/2013 0345   CrCl cannot be calculated (Patient's most recent lab result is older than the maximum 21 days allowed.).  COAG Lab Results  Component Value Date   INR 0.96 10/04/2017   INR 1.1 07/04/2013    Radiology No results found.   Assessment/Plan There are no diagnoses linked to this encounter.   Levora Dredge, MD  12/25/2022 7:23 PM

## 2022-12-26 ENCOUNTER — Ambulatory Visit (INDEPENDENT_AMBULATORY_CARE_PROVIDER_SITE_OTHER): Payer: BC Managed Care – PPO

## 2022-12-26 ENCOUNTER — Ambulatory Visit (INDEPENDENT_AMBULATORY_CARE_PROVIDER_SITE_OTHER): Payer: Medicare Other | Admitting: Vascular Surgery

## 2022-12-26 DIAGNOSIS — I714 Abdominal aortic aneurysm, without rupture, unspecified: Secondary | ICD-10-CM

## 2023-02-08 DIAGNOSIS — G4733 Obstructive sleep apnea (adult) (pediatric): Secondary | ICD-10-CM | POA: Diagnosis not present

## 2023-03-11 DIAGNOSIS — G4733 Obstructive sleep apnea (adult) (pediatric): Secondary | ICD-10-CM | POA: Diagnosis not present

## 2023-04-08 DIAGNOSIS — G4733 Obstructive sleep apnea (adult) (pediatric): Secondary | ICD-10-CM | POA: Diagnosis not present

## 2023-05-04 DIAGNOSIS — E785 Hyperlipidemia, unspecified: Secondary | ICD-10-CM | POA: Diagnosis not present

## 2023-05-04 DIAGNOSIS — I739 Peripheral vascular disease, unspecified: Secondary | ICD-10-CM | POA: Diagnosis not present

## 2023-05-04 DIAGNOSIS — I25119 Atherosclerotic heart disease of native coronary artery with unspecified angina pectoris: Secondary | ICD-10-CM | POA: Diagnosis not present

## 2023-05-04 DIAGNOSIS — E1169 Type 2 diabetes mellitus with other specified complication: Secondary | ICD-10-CM | POA: Diagnosis not present

## 2023-05-04 DIAGNOSIS — Z8679 Personal history of other diseases of the circulatory system: Secondary | ICD-10-CM | POA: Diagnosis not present

## 2023-05-04 DIAGNOSIS — G4733 Obstructive sleep apnea (adult) (pediatric): Secondary | ICD-10-CM | POA: Diagnosis not present

## 2023-05-04 DIAGNOSIS — I10 Essential (primary) hypertension: Secondary | ICD-10-CM | POA: Diagnosis not present

## 2023-05-09 DIAGNOSIS — G4733 Obstructive sleep apnea (adult) (pediatric): Secondary | ICD-10-CM | POA: Diagnosis not present

## 2023-05-29 DIAGNOSIS — E7800 Pure hypercholesterolemia, unspecified: Secondary | ICD-10-CM | POA: Diagnosis not present

## 2023-05-29 DIAGNOSIS — E785 Hyperlipidemia, unspecified: Secondary | ICD-10-CM | POA: Diagnosis not present

## 2023-05-29 DIAGNOSIS — E1169 Type 2 diabetes mellitus with other specified complication: Secondary | ICD-10-CM | POA: Diagnosis not present

## 2023-06-05 DIAGNOSIS — E039 Hypothyroidism, unspecified: Secondary | ICD-10-CM | POA: Diagnosis not present

## 2023-06-05 DIAGNOSIS — E7800 Pure hypercholesterolemia, unspecified: Secondary | ICD-10-CM | POA: Diagnosis not present

## 2023-06-05 DIAGNOSIS — E785 Hyperlipidemia, unspecified: Secondary | ICD-10-CM | POA: Diagnosis not present

## 2023-06-05 DIAGNOSIS — Z862 Personal history of diseases of the blood and blood-forming organs and certain disorders involving the immune mechanism: Secondary | ICD-10-CM | POA: Diagnosis not present

## 2023-06-05 DIAGNOSIS — Z1331 Encounter for screening for depression: Secondary | ICD-10-CM | POA: Diagnosis not present

## 2023-06-05 DIAGNOSIS — Z0000 Encounter for general adult medical examination without abnormal findings: Secondary | ICD-10-CM | POA: Diagnosis not present

## 2023-06-05 DIAGNOSIS — I10 Essential (primary) hypertension: Secondary | ICD-10-CM | POA: Diagnosis not present

## 2023-06-05 DIAGNOSIS — E1169 Type 2 diabetes mellitus with other specified complication: Secondary | ICD-10-CM | POA: Diagnosis not present

## 2023-06-06 ENCOUNTER — Encounter (INDEPENDENT_AMBULATORY_CARE_PROVIDER_SITE_OTHER): Payer: Self-pay

## 2023-06-08 DIAGNOSIS — G4733 Obstructive sleep apnea (adult) (pediatric): Secondary | ICD-10-CM | POA: Diagnosis not present

## 2023-07-09 DIAGNOSIS — G4733 Obstructive sleep apnea (adult) (pediatric): Secondary | ICD-10-CM | POA: Diagnosis not present

## 2023-07-17 DIAGNOSIS — G4733 Obstructive sleep apnea (adult) (pediatric): Secondary | ICD-10-CM | POA: Diagnosis not present

## 2023-08-08 DIAGNOSIS — G4733 Obstructive sleep apnea (adult) (pediatric): Secondary | ICD-10-CM | POA: Diagnosis not present

## 2023-08-09 DIAGNOSIS — G4733 Obstructive sleep apnea (adult) (pediatric): Secondary | ICD-10-CM | POA: Diagnosis not present

## 2023-10-14 ENCOUNTER — Other Ambulatory Visit: Payer: Self-pay | Admitting: Medical Genetics

## 2023-10-16 ENCOUNTER — Other Ambulatory Visit
Admission: RE | Admit: 2023-10-16 | Discharge: 2023-10-16 | Disposition: A | Discharge status: Home / Self Care | Payer: Self-pay | Source: Ambulatory Visit | Attending: Medical Genetics | Admitting: Medical Genetics

## 2023-10-27 LAB — GENECONNECT MOLECULAR SCREEN: Genetic Analysis Overall Interpretation: NEGATIVE

## 2024-02-07 ENCOUNTER — Telehealth (INDEPENDENT_AMBULATORY_CARE_PROVIDER_SITE_OTHER): Payer: Self-pay | Admitting: Vascular Surgery

## 2024-02-07 NOTE — Telephone Encounter (Signed)
 He should have an EVAR

## 2024-02-07 NOTE — Telephone Encounter (Signed)
 Patient called stating that he usually has a yearly follow up and did not have one this past December. Can you look into this and see what the patient is needing so that patient can be scheduled. Thank you

## 2024-02-20 ENCOUNTER — Other Ambulatory Visit (INDEPENDENT_AMBULATORY_CARE_PROVIDER_SITE_OTHER): Payer: Self-pay | Admitting: Nurse Practitioner

## 2024-02-20 DIAGNOSIS — I714 Abdominal aortic aneurysm, without rupture, unspecified: Secondary | ICD-10-CM

## 2024-02-21 NOTE — Progress Notes (Unsigned)
 "                                                                      MRN : [id number]  Jeffery Giles is a 79 y.o. (Apr 16, 1945) male who presents with chief complaint of check circulation.  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 No groin related complaints. No symptoms consistent with distal embolization No changes in claudication distance.    There have been no interval changes in his overall healthcare since his last visit except his BP has been up and recently his antihypertensive medication was changed.     He states the discoloration of his ankle is about the same.  This is painless.  Has been progressive with time.  No itching or burning associated with it.  No ulcerations associated.  He has not been wearing compression.   Patient denies amaurosis fugax or TIA symptoms. There is no history of claudication or rest pain symptoms of the lower extremities. The patient denies angina or shortness of breath.    Todays duplex ultrasound of the aorta iliac system shows a widely patent stent graft with triphasic signals throughout.  The maximal diameter of the aortic sac is 5.1 cm.  There is no evidence of endoleak.  Previous duplex dated 06/24/2019  diameter of the aortic sac is 5.6 cm.   ABI's=1.09 and Lt=1.06 (previous ABI's=1.08 and Lt=1.17)    Active Medications[1]  Past Medical History:  Diagnosis Date   Hypertension     Past Surgical History:  Procedure Laterality Date   ABDOMINAL AORTIC ANEURYSM REPAIR     ENDOVASCULAR STENT GRAFT (AAA) N/A 10/04/2017   Procedure: ENDOVASCULAR REPAIR/STENT GRAFT;  Surgeon: Jama Cordella MATSU, MD;  Location: [facility] INV[facility] CV [facility];  Service: Cardiovascular;  Laterality: N/A;   HERNIA REPAIR      Social History Social History[2]  Family History Family History  Problem Relation Age of Onset   Stroke Father     Allergies[3]   REVIEW OF SYSTEMS (Negative unless checked)  Constitutional: [] Weight loss  [] Fever  [] Chills Cardiac: [] Chest pain   [] Chest pressure   [] Palpitations   [] Shortness of breath when laying flat   [] Shortness of breath with exertion. Vascular:  [x] Pain in legs with walking   [] Pain in legs at rest  [] History of DVT   [] Phlebitis   [] Swelling in legs   [] Varicose veins   [] Non-healing ulcers Pulmonary:   [] Uses home oxygen   [] Productive cough   [] Hemoptysis   [] Wheeze  [] COPD   [] Asthma Neurologic:  []   Dizziness   [] Seizures   [] History of stroke   [] History of TIA  [] Aphasia   [] Vissual changes   [] Weakness or numbness in arm   [] Weakness or numbness in leg Musculoskeletal:   [] Joint swelling   [] Joint pain   [] Low back pain Hematologic:  [] Easy bruising  [] Easy bleeding   [] Hypercoagulable state   [] Anemic Gastrointestinal:  [] Diarrhea   [] Vomiting  [] Gastroesophageal reflux/heartburn   [] Difficulty swallowing. Genitourinary:  [] Chronic kidney disease   [] Difficult urination  [] Frequent urination   [] Blood in urine Skin:  [] Rashes   [] Ulcers  Psychological:  [] History of anxiety   []  History of major depression.  Physical Examination  There were no vitals filed for this visit. There is no height or weight on file to calculate BMI. Gen: WD/WN, NAD Head:  [place]/AT, No temporalis wasting.  Ear/Nose/Throat: Hearing grossly intact, nares w/o erythema or drainage Eyes: PER, EOMI, sclera nonicteric.  Neck: Supple, no masses.  No bruit or JVD.  Pulmonary:  Good air movement, no audible wheezing, no use of accessory muscles.  Cardiac: RRR, normal S1, S2, no Murmurs. Vascular:  mild trophic changes, no open wounds Vessel Right Left  Radial Palpable Palpable  PT Not Palpable Not Palpable  DP Not Palpable Not Palpable  Gastrointestinal: soft, non-distended. No guarding/no peritoneal signs.  Musculoskeletal: M/S 5/5 throughout.  No visible deformity.  Neurologic: CN 2-12 intact. Pain and light touch intact in extremities.  Symmetrical.  Speech is fluent. Motor exam as listed above. Psychiatric: Judgment intact, Mood & affect appropriate for pt's clinical situation. Dermatologic: No rashes or ulcers noted.  No changes consistent with cellulitis.   CBC Lab Results  Component Value Date   WBC 11.2 (H) 10/05/2017   HGB 14.8 10/05/2017   HCT 43.8 10/05/2017   MCV 95.2 10/05/2017   PLT 184 10/05/2017    BMET    Component Value Date/Time   NA 140 10/05/2017 0501   NA 139 07/04/2013 0345   K 4.2 10/05/2017 1242   K 4.3 07/04/2013 0345   CL 103 10/05/2017 0501   CL 105 07/04/2013 0345   CO2 31 10/05/2017 0501   CO2 28 07/04/2013 0345   GLUCOSE 168 (H) 10/05/2017 0501   GLUCOSE 131 (H) 07/04/2013 0345   BUN 24 (H) 10/05/2017 0501   BUN 17 07/04/2013 0345   CREATININE 1.05 10/05/2017 0501   CREATININE 1.15 07/04/2013 0345   CALCIUM 9.4 10/05/2017 0501   CALCIUM 8.5 07/04/2013 0345   GFRNONAA >60 10/05/2017 0501   GFRNONAA >60 07/04/2013 0345   GFRAA >60 10/05/2017 0501   GFRAA >60 07/04/2013 0345   CrCl cannot be calculated (Patient's most recent lab result is older than the maximum 21 days allowed.).  COAG Lab Results  Component Value Date   INR 0.96 10/04/2017   INR 1.1 07/04/2013    Radiology No results  found.   Assessment/Plan There are no diagnoses linked to this encounter.   Cordella Shawl, MD  02/21/2024 6:40 PM      [1]  No outpatient medications have been marked as taking for the 02/22/24 encounter (Appointment) with Shawl, Cordella MATSU, MD.  [2]  Social History Tobacco Use   Smoking status: Never   Smokeless tobacco: Never  Substance Use Topics   Alcohol use: Not Currently    Comment: ocassionally   Drug use: No  [3] No Known Allergies  "

## 2024-02-22 ENCOUNTER — Encounter (INDEPENDENT_AMBULATORY_CARE_PROVIDER_SITE_OTHER): Payer: Self-pay | Admitting: Vascular Surgery

## 2024-02-22 ENCOUNTER — Other Ambulatory Visit (INDEPENDENT_AMBULATORY_CARE_PROVIDER_SITE_OTHER)

## 2024-02-22 ENCOUNTER — Ambulatory Visit (INDEPENDENT_AMBULATORY_CARE_PROVIDER_SITE_OTHER): Admitting: Vascular Surgery

## 2024-02-22 VITALS — BP 153/79 | HR 51 | Resp 18 | Wt 201.6 lb

## 2024-02-22 DIAGNOSIS — I25119 Atherosclerotic heart disease of native coronary artery with unspecified angina pectoris: Secondary | ICD-10-CM

## 2024-02-22 DIAGNOSIS — I872 Venous insufficiency (chronic) (peripheral): Secondary | ICD-10-CM

## 2024-02-22 DIAGNOSIS — I70213 Atherosclerosis of native arteries of extremities with intermittent claudication, bilateral legs: Secondary | ICD-10-CM

## 2024-02-22 DIAGNOSIS — I10 Essential (primary) hypertension: Secondary | ICD-10-CM

## 2024-02-22 DIAGNOSIS — I7133 Infrarenal abdominal aortic aneurysm, ruptured: Secondary | ICD-10-CM

## 2024-02-22 DIAGNOSIS — I714 Abdominal aortic aneurysm, without rupture, unspecified: Secondary | ICD-10-CM

## 2024-02-22 DIAGNOSIS — I7143 Infrarenal abdominal aortic aneurysm, without rupture: Secondary | ICD-10-CM

## 2024-02-29 ENCOUNTER — Ambulatory Visit
